# Patient Record
Sex: Female | Born: 1935
Health system: Southern US, Community
[De-identification: ages and names within clinical notes are randomized; demographics above are authoritative.]

## PROBLEM LIST (undated history)

## (undated) DIAGNOSIS — J45909 Unspecified asthma, uncomplicated: Secondary | ICD-10-CM

---

## 1999-12-14 ENCOUNTER — Other Ambulatory Visit: Admission: RE | Admit: 1999-12-14 | Discharge: 1999-12-14 | Payer: Self-pay | Admitting: Family Medicine

## 2004-10-28 ENCOUNTER — Inpatient Hospital Stay (HOSPITAL_COMMUNITY): Admission: EM | Admit: 2004-10-28 | Discharge: 2004-11-03 | Payer: Self-pay | Admitting: Emergency Medicine

## 2005-06-10 ENCOUNTER — Emergency Department (HOSPITAL_COMMUNITY): Admission: EM | Admit: 2005-06-10 | Discharge: 2005-06-10 | Payer: Self-pay | Admitting: Emergency Medicine

## 2011-11-29 DIAGNOSIS — K5900 Constipation, unspecified: Secondary | ICD-10-CM | POA: Diagnosis not present

## 2011-12-17 DIAGNOSIS — I10 Essential (primary) hypertension: Secondary | ICD-10-CM | POA: Diagnosis not present

## 2011-12-17 DIAGNOSIS — R195 Other fecal abnormalities: Secondary | ICD-10-CM | POA: Diagnosis not present

## 2011-12-17 DIAGNOSIS — R7309 Other abnormal glucose: Secondary | ICD-10-CM | POA: Diagnosis not present

## 2011-12-17 DIAGNOSIS — Z79899 Other long term (current) drug therapy: Secondary | ICD-10-CM | POA: Diagnosis not present

## 2011-12-17 DIAGNOSIS — E785 Hyperlipidemia, unspecified: Secondary | ICD-10-CM | POA: Diagnosis not present

## 2012-01-08 DIAGNOSIS — Z1231 Encounter for screening mammogram for malignant neoplasm of breast: Secondary | ICD-10-CM | POA: Diagnosis not present

## 2012-01-16 DIAGNOSIS — R059 Cough, unspecified: Secondary | ICD-10-CM | POA: Diagnosis not present

## 2012-01-16 DIAGNOSIS — A499 Bacterial infection, unspecified: Secondary | ICD-10-CM | POA: Diagnosis not present

## 2012-01-16 DIAGNOSIS — J4489 Other specified chronic obstructive pulmonary disease: Secondary | ICD-10-CM | POA: Diagnosis not present

## 2012-01-16 DIAGNOSIS — N390 Urinary tract infection, site not specified: Secondary | ICD-10-CM | POA: Diagnosis not present

## 2012-01-16 DIAGNOSIS — J449 Chronic obstructive pulmonary disease, unspecified: Secondary | ICD-10-CM | POA: Diagnosis not present

## 2012-01-16 DIAGNOSIS — R05 Cough: Secondary | ICD-10-CM | POA: Diagnosis not present

## 2012-01-17 DIAGNOSIS — N390 Urinary tract infection, site not specified: Secondary | ICD-10-CM | POA: Diagnosis not present

## 2012-01-17 DIAGNOSIS — J449 Chronic obstructive pulmonary disease, unspecified: Secondary | ICD-10-CM | POA: Diagnosis not present

## 2012-02-19 DIAGNOSIS — R195 Other fecal abnormalities: Secondary | ICD-10-CM | POA: Diagnosis not present

## 2012-02-26 DIAGNOSIS — R195 Other fecal abnormalities: Secondary | ICD-10-CM | POA: Diagnosis not present

## 2012-02-26 DIAGNOSIS — K5900 Constipation, unspecified: Secondary | ICD-10-CM | POA: Diagnosis not present

## 2012-05-02 DIAGNOSIS — R195 Other fecal abnormalities: Secondary | ICD-10-CM | POA: Diagnosis not present

## 2012-05-02 DIAGNOSIS — E785 Hyperlipidemia, unspecified: Secondary | ICD-10-CM | POA: Diagnosis not present

## 2012-05-02 DIAGNOSIS — I10 Essential (primary) hypertension: Secondary | ICD-10-CM | POA: Diagnosis not present

## 2012-05-02 DIAGNOSIS — R7309 Other abnormal glucose: Secondary | ICD-10-CM | POA: Diagnosis not present

## 2012-06-20 DIAGNOSIS — K5900 Constipation, unspecified: Secondary | ICD-10-CM | POA: Diagnosis not present

## 2012-11-03 DIAGNOSIS — I10 Essential (primary) hypertension: Secondary | ICD-10-CM | POA: Diagnosis not present

## 2012-11-03 DIAGNOSIS — E785 Hyperlipidemia, unspecified: Secondary | ICD-10-CM | POA: Diagnosis not present

## 2012-11-03 DIAGNOSIS — R7309 Other abnormal glucose: Secondary | ICD-10-CM | POA: Diagnosis not present

## 2012-11-03 DIAGNOSIS — D649 Anemia, unspecified: Secondary | ICD-10-CM | POA: Diagnosis not present

## 2012-11-03 DIAGNOSIS — Z23 Encounter for immunization: Secondary | ICD-10-CM | POA: Diagnosis not present

## 2012-11-03 DIAGNOSIS — E0590 Thyrotoxicosis, unspecified without thyrotoxic crisis or storm: Secondary | ICD-10-CM | POA: Diagnosis not present

## 2013-05-05 DIAGNOSIS — Z23 Encounter for immunization: Secondary | ICD-10-CM | POA: Diagnosis not present

## 2013-05-05 DIAGNOSIS — E785 Hyperlipidemia, unspecified: Secondary | ICD-10-CM | POA: Diagnosis not present

## 2013-05-05 DIAGNOSIS — IMO0002 Reserved for concepts with insufficient information to code with codable children: Secondary | ICD-10-CM | POA: Diagnosis not present

## 2013-05-05 DIAGNOSIS — Z9181 History of falling: Secondary | ICD-10-CM | POA: Diagnosis not present

## 2013-05-05 DIAGNOSIS — Z1331 Encounter for screening for depression: Secondary | ICD-10-CM | POA: Diagnosis not present

## 2013-05-05 DIAGNOSIS — E119 Type 2 diabetes mellitus without complications: Secondary | ICD-10-CM | POA: Diagnosis not present

## 2013-05-18 DIAGNOSIS — M810 Age-related osteoporosis without current pathological fracture: Secondary | ICD-10-CM | POA: Diagnosis not present

## 2013-05-18 DIAGNOSIS — Z1231 Encounter for screening mammogram for malignant neoplasm of breast: Secondary | ICD-10-CM | POA: Diagnosis not present

## 2013-05-18 DIAGNOSIS — N951 Menopausal and female climacteric states: Secondary | ICD-10-CM | POA: Diagnosis not present

## 2013-10-28 DIAGNOSIS — E119 Type 2 diabetes mellitus without complications: Secondary | ICD-10-CM | POA: Diagnosis not present

## 2013-10-28 DIAGNOSIS — I10 Essential (primary) hypertension: Secondary | ICD-10-CM | POA: Diagnosis not present

## 2013-10-28 DIAGNOSIS — E785 Hyperlipidemia, unspecified: Secondary | ICD-10-CM | POA: Diagnosis not present

## 2013-10-28 DIAGNOSIS — D649 Anemia, unspecified: Secondary | ICD-10-CM | POA: Diagnosis not present

## 2013-10-28 DIAGNOSIS — Z23 Encounter for immunization: Secondary | ICD-10-CM | POA: Diagnosis not present

## 2013-11-17 DIAGNOSIS — H5200 Hypermetropia, unspecified eye: Secondary | ICD-10-CM | POA: Diagnosis not present

## 2013-11-17 DIAGNOSIS — H2589 Other age-related cataract: Secondary | ICD-10-CM | POA: Diagnosis not present

## 2013-12-31 ENCOUNTER — Emergency Department (HOSPITAL_COMMUNITY): Payer: Medicare Other

## 2013-12-31 ENCOUNTER — Inpatient Hospital Stay (HOSPITAL_COMMUNITY)
Admission: EM | Admit: 2013-12-31 | Discharge: 2014-01-05 | DRG: 203 | Disposition: A | Discharge status: Home Health | Payer: Medicare Other | Attending: Internal Medicine | Admitting: Internal Medicine

## 2013-12-31 ENCOUNTER — Encounter (HOSPITAL_COMMUNITY): Payer: Self-pay | Admitting: Emergency Medicine

## 2013-12-31 DIAGNOSIS — K449 Diaphragmatic hernia without obstruction or gangrene: Secondary | ICD-10-CM | POA: Diagnosis present

## 2013-12-31 DIAGNOSIS — R109 Unspecified abdominal pain: Secondary | ICD-10-CM

## 2013-12-31 DIAGNOSIS — N281 Cyst of kidney, acquired: Secondary | ICD-10-CM | POA: Diagnosis not present

## 2013-12-31 DIAGNOSIS — R079 Chest pain, unspecified: Secondary | ICD-10-CM | POA: Diagnosis not present

## 2013-12-31 DIAGNOSIS — Z87891 Personal history of nicotine dependence: Secondary | ICD-10-CM | POA: Diagnosis not present

## 2013-12-31 DIAGNOSIS — J209 Acute bronchitis, unspecified: Secondary | ICD-10-CM | POA: Diagnosis present

## 2013-12-31 DIAGNOSIS — R0902 Hypoxemia: Secondary | ICD-10-CM | POA: Diagnosis not present

## 2013-12-31 DIAGNOSIS — R0602 Shortness of breath: Secondary | ICD-10-CM | POA: Diagnosis not present

## 2013-12-31 DIAGNOSIS — Z79899 Other long term (current) drug therapy: Secondary | ICD-10-CM

## 2013-12-31 DIAGNOSIS — K2970 Gastritis, unspecified, without bleeding: Secondary | ICD-10-CM | POA: Diagnosis not present

## 2013-12-31 DIAGNOSIS — J40 Bronchitis, not specified as acute or chronic: Secondary | ICD-10-CM

## 2013-12-31 DIAGNOSIS — J45901 Unspecified asthma with (acute) exacerbation: Principal | ICD-10-CM

## 2013-12-31 DIAGNOSIS — D509 Iron deficiency anemia, unspecified: Secondary | ICD-10-CM | POA: Diagnosis not present

## 2013-12-31 DIAGNOSIS — R10819 Abdominal tenderness, unspecified site: Secondary | ICD-10-CM | POA: Diagnosis not present

## 2013-12-31 DIAGNOSIS — R933 Abnormal findings on diagnostic imaging of other parts of digestive tract: Secondary | ICD-10-CM | POA: Diagnosis not present

## 2013-12-31 DIAGNOSIS — J45909 Unspecified asthma, uncomplicated: Secondary | ICD-10-CM

## 2013-12-31 DIAGNOSIS — K2990 Gastroduodenitis, unspecified, without bleeding: Secondary | ICD-10-CM | POA: Diagnosis not present

## 2013-12-31 DIAGNOSIS — I10 Essential (primary) hypertension: Secondary | ICD-10-CM | POA: Diagnosis not present

## 2013-12-31 DIAGNOSIS — R110 Nausea: Secondary | ICD-10-CM | POA: Diagnosis not present

## 2013-12-31 HISTORY — DX: Unspecified asthma, uncomplicated: J45.909

## 2013-12-31 LAB — I-STAT CG4 LACTIC ACID, ED: Lactic Acid, Venous: 1.51 mmol/L (ref 0.5–2.2)

## 2013-12-31 LAB — URINALYSIS, ROUTINE W REFLEX MICROSCOPIC
Bilirubin Urine: NEGATIVE
Glucose, UA: NEGATIVE mg/dL
Hgb urine dipstick: NEGATIVE
Ketones, ur: NEGATIVE mg/dL
Leukocytes, UA: NEGATIVE
Nitrite: NEGATIVE
Protein, ur: 30 mg/dL — AB
Specific Gravity, Urine: 1.018 (ref 1.005–1.030)
Urobilinogen, UA: 1 mg/dL (ref 0.0–1.0)
pH: 5 (ref 5.0–8.0)

## 2013-12-31 LAB — COMPREHENSIVE METABOLIC PANEL
ALT: 22 U/L (ref 0–35)
Albumin: 3.4 g/dL — ABNORMAL LOW (ref 3.5–5.2)
Alkaline Phosphatase: 75 U/L (ref 39–117)
BUN: 31 mg/dL — ABNORMAL HIGH (ref 6–23)
CO2: 24 mEq/L (ref 19–32)
Calcium: 9.3 mg/dL (ref 8.4–10.5)
Chloride: 102 mEq/L (ref 96–112)
Creatinine, Ser: 1.01 mg/dL (ref 0.50–1.10)
GFR calc Af Amer: 60 mL/min — ABNORMAL LOW (ref >90)
GFR calc non Af Amer: 52 mL/min — ABNORMAL LOW (ref >90)
Glucose, Bld: 161 mg/dL — ABNORMAL HIGH (ref 70–99)
Potassium: 4.9 mEq/L (ref 3.7–5.3)
Sodium: 141 mEq/L (ref 137–147)
Total Bilirubin: <0.2 mg/dL — ABNORMAL LOW (ref 0.3–1.2)
Total Protein: 7.2 g/dL (ref 6.0–8.3)

## 2013-12-31 LAB — CBC WITH DIFFERENTIAL/PLATELET
Basophils Absolute: 0 10*3/uL (ref 0.0–0.1)
Basophils Relative: 0 % (ref 0–1)
Eosinophils Absolute: 0 K/uL (ref 0.0–0.7)
Eosinophils Relative: 0 % (ref 0–5)
HCT: 33.9 % — ABNORMAL LOW (ref 36.0–46.0)
Hemoglobin: 11.1 g/dL — ABNORMAL LOW (ref 12.0–15.0)
Lymphocytes Relative: 11 % — ABNORMAL LOW (ref 12–46)
Lymphs Abs: 1 10*3/uL (ref 0.7–4.0)
MCH: 31.1 pg (ref 26.0–34.0)
MCHC: 32.7 g/dL (ref 30.0–36.0)
MCV: 95 fL (ref 78.0–100.0)
Monocytes Absolute: 0.4 K/uL (ref 0.1–1.0)
Monocytes Relative: 4 % (ref 3–12)
Neutro Abs: 7.9 K/uL — ABNORMAL HIGH (ref 1.7–7.7)
Neutrophils Relative %: 85 % — ABNORMAL HIGH (ref 43–77)
Platelets: 390 10*3/uL (ref 150–400)
RBC: 3.57 MIL/uL — ABNORMAL LOW (ref 3.87–5.11)
RDW: 16 % — ABNORMAL HIGH (ref 11.5–15.5)
WBC: 9.3 10*3/uL (ref 4.0–10.5)

## 2013-12-31 LAB — URINE MICROSCOPIC-ADD ON

## 2013-12-31 LAB — TROPONIN I: Troponin I: <0.3 ng/mL (ref <0.30)

## 2013-12-31 LAB — COMPREHENSIVE METABOLIC PANEL WITH GFR: AST: 34 U/L (ref 0–37)

## 2013-12-31 LAB — PRO B NATRIURETIC PEPTIDE: Pro B Natriuretic peptide (BNP): 116.4 pg/mL (ref 0–450)

## 2013-12-31 LAB — LIPASE, BLOOD: Lipase: 48 U/L (ref 11–59)

## 2013-12-31 MED ORDER — ONDANSETRON HCL 4 MG/2ML IJ SOLN: 4.0000 mg | Freq: Four times a day (QID) | INTRAMUSCULAR | Status: DC | PRN

## 2013-12-31 MED ORDER — ATORVASTATIN CALCIUM 20 MG PO TABS
20.0000 mg | ORAL_TABLET | Freq: Every day | ORAL | Status: DC
  Administered 2014-01-01 – 2014-01-05 (×4): 20 mg via ORAL
  Filled 2013-12-31 (×5): qty 1

## 2013-12-31 MED ORDER — PREDNISONE 50 MG PO TABS
60.0000 mg | ORAL_TABLET | Freq: Every day | ORAL | Status: DC
  Administered 2014-01-01 – 2014-01-03 (×3): 60 mg via ORAL
  Filled 2013-12-31 (×5): qty 1

## 2013-12-31 MED ORDER — PANTOPRAZOLE SODIUM 40 MG PO TBEC
40.0000 mg | DELAYED_RELEASE_TABLET | Freq: Two times a day (BID) | ORAL | Status: DC
  Administered 2014-01-01 – 2014-01-05 (×8): 40 mg via ORAL
  Filled 2013-12-31 (×5): qty 1

## 2013-12-31 MED ORDER — SODIUM CHLORIDE 0.9 % IV SOLN
Freq: Once | INTRAVENOUS | Status: AC
  Administered 2013-12-31: 16:00:00 via INTRAVENOUS

## 2013-12-31 MED ORDER — ALBUTEROL (5 MG/ML) CONTINUOUS INHALATION SOLN: 10.0000 mg/h | INHALATION_SOLUTION | RESPIRATORY_TRACT | Status: DC

## 2013-12-31 MED ORDER — GI COCKTAIL ~~LOC~~
30.0000 mL | Freq: Three times a day (TID) | ORAL | Status: DC | PRN
  Filled 2013-12-31: qty 30

## 2013-12-31 MED ORDER — IOHEXOL 300 MG/ML  SOLN
80.0000 mL | Freq: Once | INTRAMUSCULAR | Status: AC | PRN
  Administered 2013-12-31: 80 mL via INTRAVENOUS

## 2013-12-31 MED ORDER — FENTANYL CITRATE 0.05 MG/ML IJ SOLN
50.0000 ug | Freq: Once | INTRAMUSCULAR | Status: AC
  Administered 2013-12-31: 50 ug via INTRAVENOUS
  Filled 2013-12-31: qty 2

## 2013-12-31 MED ORDER — METHYLPREDNISOLONE SODIUM SUCC 125 MG IJ SOLR
125.0000 mg | Freq: Once | INTRAMUSCULAR | Status: AC
  Administered 2013-12-31: 125 mg via INTRAVENOUS
  Filled 2013-12-31: qty 2

## 2013-12-31 MED ORDER — IOHEXOL 300 MG/ML  SOLN: 25.0000 mL | INTRAMUSCULAR | Status: AC

## 2013-12-31 MED ORDER — LOSARTAN POTASSIUM 50 MG PO TABS
100.0000 mg | ORAL_TABLET | Freq: Every day | ORAL | Status: DC
  Administered 2014-01-01 – 2014-01-05 (×4): 100 mg via ORAL
  Filled 2013-12-31 (×5): qty 2

## 2013-12-31 MED ORDER — AMLODIPINE BESYLATE 10 MG PO TABS
10.0000 mg | ORAL_TABLET | Freq: Every day | ORAL | Status: DC
  Administered 2014-01-01 – 2014-01-05 (×4): 10 mg via ORAL
  Filled 2013-12-31 (×5): qty 1

## 2013-12-31 MED ORDER — SODIUM CHLORIDE 0.9 % IV SOLN
INTRAVENOUS | Status: DC
  Administered 2013-12-31 – 2014-01-04 (×6): via INTRAVENOUS
  Administered 2014-01-05: 1000 mL via INTRAVENOUS

## 2013-12-31 MED ORDER — HEPARIN SODIUM (PORCINE) 5000 UNIT/ML IJ SOLN
5000.0000 [IU] | Freq: Three times a day (TID) | INTRAMUSCULAR | Status: DC
  Administered 2014-01-01 – 2014-01-05 (×12): 5000 [IU] via SUBCUTANEOUS
  Filled 2013-12-31 (×16): qty 1

## 2013-12-31 MED ORDER — ALBUTEROL SULFATE HFA 108 (90 BASE) MCG/ACT IN AERS: 2.0000 | INHALATION_SPRAY | RESPIRATORY_TRACT | Status: DC | PRN

## 2013-12-31 MED ORDER — ALBUTEROL SULFATE (2.5 MG/3ML) 0.083% IN NEBU
5.0000 mg | INHALATION_SOLUTION | Freq: Once | RESPIRATORY_TRACT | Status: AC
  Administered 2013-12-31: 5 mg via RESPIRATORY_TRACT
  Filled 2013-12-31: qty 6

## 2013-12-31 MED ORDER — PANTOPRAZOLE SODIUM 40 MG IV SOLR
40.0000 mg | Freq: Once | INTRAVENOUS | Status: AC
  Administered 2013-12-31: 40 mg via INTRAVENOUS
  Filled 2013-12-31: qty 40

## 2013-12-31 MED ORDER — IPRATROPIUM-ALBUTEROL 0.5-2.5 (3) MG/3ML IN SOLN
3.0000 mL | RESPIRATORY_TRACT | Status: DC
  Administered 2013-12-31 – 2014-01-01 (×2): 3 mL via RESPIRATORY_TRACT
  Filled 2013-12-31 (×3): qty 3

## 2013-12-31 NOTE — ED Notes (Signed)
Phlebotomy at bedside.

## 2013-12-31 NOTE — ED Notes (Signed)
Patient transported to CT 

## 2013-12-31 NOTE — ED Provider Notes (Signed)
CSN: [id number]     Arrival date & time 12/31/13  1507 History   First MD Initiated Contact with Patient 12/31/13 [phone]     Chief Complaint  Patient presents with  . Abdominal Pain     (Consider location/radiation/quality/duration/timing/severity/associated sxs/prior Treatment) HPI Comments: Level 5 caveat due to mild altered mentation, poor memory, poor historian. Pt brought from home by EMS, 911 called by family who are not present.  Pt has been coughing, complaining of left side chest and abd pain.  Pt thinks she might have pneumonia or kidney infection although she can't tell me if she has ever had either.  No fevers, some chills, poor appetite, when she tries to eat, gets nauseated, no vomiting.  She thinks last BM was 2 days ago, but not sure.  No diarrhea.  No new medications, but can't tell me current ones or if any allergies.  Medication list suggests h/o asthma.  Pt used to smoke, reports she quit long ago.    Patient is a 80 y.o. female presenting with abdominal pain. The history is provided by the patient and the EMS personnel. The history is limited by the condition of the patient.  Abdominal Pain   Past Medical History  Diagnosis Date  . Asthma    History reviewed. No pertinent past surgical history. History reviewed. No pertinent family history. History  Substance Use Topics  . Smoking status: Former Games developer  . Smokeless tobacco: Never Used  . Alcohol Use: No   OB History   Grav Para Term Preterm Abortions TAB SAB Ect Mult Living                 Review of Systems  Unable to perform ROS: Acuity of condition  Gastrointestinal: Positive for abdominal pain.      Allergies  Review of patient's allergies indicates no known allergies.  Home Medications   No current outpatient prescriptions on file. BP 123/61  Pulse 100  Temp(Src) 98.4 F (36.9 C) (Oral)  Resp 19  Ht 5\' 4"  (1.626 m)  Wt 119 lb (53.978 kg)  BMI 20.42 kg/m2  SpO2 88% Physical Exam  Nursing  note and vitals reviewed. Constitutional: She appears well-developed and well-nourished. She is cooperative.  Non-toxic appearance. She appears ill. She appears distressed.  HENT:  Head: Normocephalic and atraumatic.  Eyes: Conjunctivae and EOM are normal. No scleral icterus.  Neck: Normal range of motion. Neck supple. No JVD present.  Cardiovascular: Regular rhythm and intact distal pulses.  Frequent extrasystoles are present. Tachycardia present.   Pulmonary/Chest: Effort normal. She has wheezes. She has rales.  Abdominal: Soft. She exhibits no distension. There is tenderness. There is no rebound and no guarding.  Musculoskeletal: She exhibits no edema.  Neurological: She is alert. She exhibits normal muscle tone. Coordination normal.  Skin: Skin is dry. No rash noted. She is not diaphoretic.  Psychiatric: Her mood appears anxious. Her speech is not rapid and/or pressured, not delayed and not tangential. She is not agitated, not aggressive and not combative. She expresses impulsivity. She exhibits abnormal recent memory.    ED Course  Procedures (including critical care time) Labs Review Labs Reviewed  CBC WITH DIFFERENTIAL - Abnormal; Notable for the following:    RBC 3.57 (*)    Hemoglobin 11.1 (*)    HCT 33.9 (*)    RDW 16.0 (*)    Neutrophils Relative % 85 (*)    Neutro Abs 7.9 (*)    Lymphocytes Relative 11 (*)  All other components within normal limits  COMPREHENSIVE METABOLIC PANEL - Abnormal; Notable for the following:    Glucose, Bld 161 (*)    BUN 31 (*)    Albumin 3.4 (*)    Total Bilirubin <0.2 (*)    GFR calc non Af Amer 52 (*)    GFR calc Af Amer 60 (*)    All other components within normal limits  URINALYSIS, ROUTINE W REFLEX MICROSCOPIC - Abnormal; Notable for the following:    Protein, ur 30 (*)    All other components within normal limits  URINE MICROSCOPIC-ADD ON - Abnormal; Notable for the following:    Casts HYALINE CASTS (*)    All other components  within normal limits  CBC - Abnormal; Notable for the following:    RBC 3.18 (*)    Hemoglobin 9.7 (*)    HCT 30.5 (*)    RDW 16.1 (*)    All other components within normal limits  BASIC METABOLIC PANEL - Abnormal; Notable for the following:    Glucose, Bld 149 (*)    GFR calc non Af Amer 61 (*)    GFR calc Af Amer 71 (*)    All other components within normal limits  CULTURE, BLOOD (ROUTINE X 2)  CULTURE, BLOOD (ROUTINE X 2)  LIPASE, BLOOD  TROPONIN I  PRO B NATRIURETIC PEPTIDE  OCCULT BLOOD X 1 CARD TO LAB, STOOL  I-STAT CG4 LACTIC ACID, ED   Imaging Review Ct Abdomen Pelvis W Contrast  12/31/2013   CLINICAL DATA:  Left-sided abdominal pain.  Nausea.  EXAM: CT ABDOMEN AND PELVIS WITH CONTRAST  TECHNIQUE: Multidetector CT imaging of the abdomen and pelvis was performed using the standard protocol following bolus administration of intravenous contrast.  CONTRAST:  80mL OMNIPAQUE IOHEXOL 300 MG/ML IV. Oral contrast was also administered.  COMPARISON:  CT ABD-PELV W/ CM dated 05/15/2011; CT ABD W/CM dated 10/28/2004; CT PELVIS W/CM dated 10/28/2004  FINDINGS: Cecal, descending and sigmoid colon diverticulosis without evidence of acute diverticulitis. Large stool burden throughout the colon. Thickening of the wall of the proximal stomach at the esophagogastric junction, not present on the prior examinations. Remainder of the stomach normal in appearance wall thickening involving the descending duodenum. Remainder of the small bowel normal in appearance. No ascites.  Small cysts in the posterior segment right lobe of liver, unchanged; no significant abnormality involving the liver. Normal spleen, pancreas, and gallbladder. No biliary ductal dilation. Bilateral adrenal enlargement with nodularity involving the left adrenal gland, unchanged, the left adrenal measuring approximately 3.6 x 2.6 cm. Cortical cysts involving both kidneys which are otherwise unremarkable. Extensive aortoiliofemoral and  visceral artery atherosclerosis without aneurysm. No significant lymphadenopathy.  Uterus not visualized and either surgically absent or markedly atrophic. No adnexal masses or free pelvic fluid. Numerous phleboliths in the pelvis. Urinary bladder decompressed and unremarkable.  Bone window images demonstrate severe degenerative changes involving both hips, thoracolumbar scoliosis convex right, severe degenerative disc disease and spondylosis at every level from L2-3 through L5-S1, and lower thoracic spondylosis. Visualized lung bases clear. Bochdalek's hernia involving the posterior right hemidiaphragm with extrusion of intra-abdominal fat, unchanged. Heart size upper normal slightly enlarged but stable.  IMPRESSION: 1. Thickening of the wall of the proximal stomach beginning at the esophagogastric junction, a new finding since the prior examinations. While this might reflect focal inflammation, malignancy is not excluded based on the appearance. Upper endoscopy may be helpful in further evaluation. 2. Thickening of the wall of the descending duodenum  which could also be evaluated at upper endoscopy. 3. Stable adenomatous adrenal hyperplasia. 4. Osseous findings and other incidental findings as above.   Electronically Signed   By: Hulan Saashomas  Lawrence M.D.   On: 12/31/2013 20:21   Dg Chest Port 1 View  12/31/2013   CLINICAL DATA:  Chest pain  EXAM: PORTABLE CHEST - 1 VIEW  COMPARISON:  01/17/2012  FINDINGS: Cardiac shadow is stable. Chronic blunting of left costophrenic angle is noted. No focal infiltrate or sizable effusion is seen. No pneumothorax is noted. No acute bony abnormality is seen.  IMPRESSION: Chronic changes on the left.  No acute abnormality noted.   Electronically Signed   By: Alcide CleverMark  Lukens M.D.   On: 12/31/2013 16:10     RA sat is 89% and I interpret to be abnormal  Improved with 4L [place] O2 to mid 90's.   After nebs, pt continues to have wheezing, O2 sats still in low 90's despite  supplemental.  Pt unable to ambulate well after nebs.  Pt is drinking fluids somewhat better.  Still having abd pain despite pain medications.  CT shows likely GERD changes, although recommendations for endoscopy present.  Will start IV protonix also.   Given continued dyspnea, will admit for continued treatment for bronchitis, wheezing, hypoxia and abd pains.     MDM   Final diagnoses:  Asthma  Bronchitis  Hypoxia  Abdominal pain    Pt with left side pain, abn lung sounds, mild hypoxia.  Pt may have asthma exacerbation, bronchitis, pnuemonia, or possibly CHF.  Will check UA, abd labs including lytes, lipase, hepatic.  Will continue to monitor, provide breathing nebs and obtain PCXR.      Gavin PoundMichael Y. Chistopher Mangino, MD 01/01/14 1455

## 2013-12-31 NOTE — ED Notes (Signed)
Family nurses station pt has finished contrast.

## 2013-12-31 NOTE — ED Notes (Signed)
Report called to floor

## 2013-12-31 NOTE — ED Notes (Signed)
NOTIFIED DR. GHIM IN PERSON OF PATIENTS LAB RESULTS OF CG4+ LACTIC ACID ,17:15 PM .12/31/2013.

## 2013-12-31 NOTE — ED Notes (Signed)
Called RT about Wheeze Protocol

## 2013-12-31 NOTE — ED Notes (Signed)
[facility] 80 [facility] with abdominal pain and productive cough for one week.  Denies V/D but pt has nausea in which [facility] gave 4 mg of Zofran in route.  Pt also c/o left sided abdominal pain upon palpation and pt is ST on monitor.

## 2013-12-31 NOTE — ED Notes (Addendum)
Pt had no difficulties with fluid challenge. Ambulated pt in hallway with walker on room air. 02 sat 87-89%, HR 122. Pt denies dizziness pt states feeling weak. Pt placed back on monitor, 02 100 % on 2.5 lt .

## 2013-12-31 NOTE — ED Notes (Signed)
Pt given cup of water 

## 2013-12-31 NOTE — H&P (Signed)
[facility] History and Physical  Sara Gibson ZOX:[id number] DOB: [birth year] DOA: 12/31/2013  Referring physician: EDP PCP: Sara Ravel, MD   Chief Complaint: Abd pain, SOB   HPI: Sara Gibson is a 80 y.o. female who presents to the ED with Abd pain and productive cough for 1 week history.  No V/D but does have nausea.  Epigastric pain, worse with eating.  Also has productive cough for the past 1 week.  No fevers no chills.  Is SOB.  Work up in ED suggestive of acute bronchitis / asthma exacerbation, and probably gastritis / duodenitis.  Review of Systems: Systems reviewed.  As above, otherwise negative  History reviewed. No pertinent past medical history. History reviewed. No pertinent past surgical history. Social History:  reports that she has quit smoking. She has never used smokeless tobacco. She reports that she does not drink alcohol or use illicit drugs.  No Known Allergies  History reviewed. No pertinent family history.   Prior to Admission medications   Medication Sig Start Date End Date Taking? Authorizing Provider  alendronate (FOSAMAX) 70 MG tablet Take 70 mg by mouth once a week.  12/14/13  Yes Historical Provider, MD  amLODipine (NORVASC) 10 MG tablet Take 10 mg by mouth daily.  12/04/13  Yes Historical Provider, MD  atorvastatin (LIPITOR) 20 MG tablet Take 20 mg by mouth daily.  12/19/13  Yes Historical Provider, MD  losartan (COZAAR) 100 MG tablet Take 100 mg by mouth daily.  12/15/13  Yes Historical Provider, MD  PROVENTIL HFA 108 (90 BASE) MCG/ACT inhaler Inhale 2 puffs into the lungs every 4 (four) hours as needed for shortness of breath.  12/04/13  Yes Historical Provider, MD   Physical Exam: Filed Vitals:   12/31/13 2215  BP: 122/68  Pulse: 103  Temp:   Resp: 20    BP 122/68  Pulse 103  Temp(Src) 97.8 F (36.6 C) (Oral)  Resp 20  SpO2 100%  General Appearance:    Alert, oriented, no distress, appears stated age  Head:    Normocephalic,  atraumatic  Eyes:    PERRL, EOMI, sclera non-icteric        Nose:   Nares without drainage or epistaxis. Mucosa, turbinates normal  Throat:   Moist mucous membranes. Oropharynx without erythema or exudate.  Neck:   Supple. No carotid bruits.  No thyromegaly.  No lymphadenopathy.   Back:     No CVA tenderness, no spinal tenderness  Lungs:     Rales but no wheezes.  Chest wall:    No tenderness to palpitation  Heart:    Regular rate and rhythm without murmurs, gallops, rubs  Abdomen:     Soft, non-tender, nondistended, normal bowel sounds, no organomegaly  Genitalia:    deferred  Rectal:    deferred  Extremities:   No clubbing, cyanosis or edema.  Pulses:   2+ and symmetric all extremities  Skin:   Skin color, texture, turgor normal, no rashes or lesions  Lymph nodes:   Cervical, supraclavicular, and axillary nodes normal  Neurologic:   CNII-XII intact. Normal strength, sensation and reflexes      throughout    Labs on Admission:  Basic Metabolic Panel:  Recent Labs Lab 12/31/13 1529  NA 141  K 4.9  CL 102  CO2 24  GLUCOSE 161*  BUN 31*  CREATININE 1.01  CALCIUM 9.3   Liver Function Tests:  Recent Labs Lab 12/31/13 1529  AST 34  ALT 22  ALKPHOS 75  BILITOT <0.2*  PROT 7.2  ALBUMIN 3.4*    Recent Labs Lab 12/31/13 1529  LIPASE 48   No results found for this basename: AMMONIA,  in the last 168 hours CBC:  Recent Labs Lab 12/31/13 1529  WBC 9.3  NEUTROABS 7.9*  HGB 11.1*  HCT 33.9*  MCV 95.0  PLT 390   Cardiac Enzymes:  Recent Labs Lab 12/31/13 1529  TROPONINI <0.30    BNP (last 3 results)  Recent Labs  12/31/13 1529  PROBNP 116.4   CBG: No results found for this basename: GLUCAP,  in the last 168 hours  Radiological Exams on Admission: Ct Abdomen Pelvis W Contrast  12/31/2013   CLINICAL DATA:  Left-sided abdominal pain.  Nausea.  EXAM: CT ABDOMEN AND PELVIS WITH CONTRAST  TECHNIQUE: Multidetector CT imaging of the abdomen and pelvis  was performed using the standard protocol following bolus administration of intravenous contrast.  CONTRAST:  80mL OMNIPAQUE IOHEXOL 300 MG/ML IV. Oral contrast was also administered.  COMPARISON:  CT ABD-PELV W/ CM dated 05/15/2011; CT ABD W/CM dated 10/28/2004; CT PELVIS W/CM dated 10/28/2004  FINDINGS: Cecal, descending and sigmoid colon diverticulosis without evidence of acute diverticulitis. Large stool burden throughout the colon. Thickening of the wall of the proximal stomach at the esophagogastric junction, not present on the prior examinations. Remainder of the stomach normal in appearance wall thickening involving the descending duodenum. Remainder of the small bowel normal in appearance. No ascites.  Small cysts in the posterior segment right lobe of liver, unchanged; no significant abnormality involving the liver. Normal spleen, pancreas, and gallbladder. No biliary ductal dilation. Bilateral adrenal enlargement with nodularity involving the left adrenal gland, unchanged, the left adrenal measuring approximately 3.6 x 2.6 cm. Cortical cysts involving both kidneys which are otherwise unremarkable. Extensive aortoiliofemoral and visceral artery atherosclerosis without aneurysm. No significant lymphadenopathy.  Uterus not visualized and either surgically absent or markedly atrophic. No adnexal masses or free pelvic fluid. Numerous phleboliths in the pelvis. Urinary bladder decompressed and unremarkable.  Bone window images demonstrate severe degenerative changes involving both hips, thoracolumbar scoliosis convex right, severe degenerative disc disease and spondylosis at every level from L2-3 through L5-S1, and lower thoracic spondylosis. Visualized lung bases clear. Bochdalek's hernia involving the posterior right hemidiaphragm with extrusion of intra-abdominal fat, unchanged. Heart size upper normal slightly enlarged but stable.  IMPRESSION: 1. Thickening of the wall of the proximal stomach beginning at the  esophagogastric junction, a new finding since the prior examinations. While this might reflect focal inflammation, malignancy is not excluded based on the appearance. Upper endoscopy may be helpful in further evaluation. 2. Thickening of the wall of the descending duodenum which could also be evaluated at upper endoscopy. 3. Stable adenomatous adrenal hyperplasia. 4. Osseous findings and other incidental findings as above.   Electronically Signed   By: Hulan Saas M.D.   On: 12/31/2013 20:21   Dg Chest Port 1 View  12/31/2013   CLINICAL DATA:  Chest pain  EXAM: PORTABLE CHEST - 1 VIEW  COMPARISON:  01/17/2012  FINDINGS: Cardiac shadow is stable. Chronic blunting of left costophrenic angle is noted. No focal infiltrate or sizable effusion is seen. No pneumothorax is noted. No acute bony abnormality is seen.  IMPRESSION: Chronic changes on the left.  No acute abnormality noted.   Electronically Signed   By: Alcide Clever M.D.   On: 12/31/2013 16:10    EKG: Independently reviewed.  Assessment/Plan Principal Problem:   Asthma exacerbation Active Problems:  Abdominal pain   1. Asthma exacerbation / acute viral bronchitis - patient's wheezing that she presented with has resolved after numerous breathing treatments and steroids.  Continue steroids and breathing treatments per adult wheeze protocol.  Patient satting 100% and no respiratory distress when stationary on 2.5L via [place].  Is SOB when ambulatory however and desaturates to upper 80s. 2. Abdominal pain - CT scan shows thickening of gastroduodenal junction and duodenum.  Suspect inflammatory or possibly ulcerative pathology given the acute presentation of this.  Neoplasm possible but fits clinical scenario a bit less given the acuity of symptoms.  Treating with PPIs and GI cocktails empirically.  If persists then will require GI eval for EGD, and may require this anyhow.    Code Status: Full Code  Family Communication: Family at  bedside Disposition Plan: Admit to obs   Time spent: 70 min  Lincoln Kleiner M. [facility] Pager [phone]  If 7AM-7PM, please contact the day team taking care of the patient [email or web address] Password [email or web address] 12/31/2013, 10:40 PM

## 2013-12-31 NOTE — ED Notes (Signed)
CT called about contrast. 

## 2014-01-01 ENCOUNTER — Encounter (HOSPITAL_COMMUNITY): Payer: Self-pay | Admitting: *Deleted

## 2014-01-01 DIAGNOSIS — J45909 Unspecified asthma, uncomplicated: Secondary | ICD-10-CM | POA: Diagnosis not present

## 2014-01-01 DIAGNOSIS — R0902 Hypoxemia: Secondary | ICD-10-CM

## 2014-01-01 DIAGNOSIS — R109 Unspecified abdominal pain: Secondary | ICD-10-CM | POA: Diagnosis not present

## 2014-01-01 DIAGNOSIS — J45901 Unspecified asthma with (acute) exacerbation: Secondary | ICD-10-CM | POA: Diagnosis not present

## 2014-01-01 LAB — CBC
HEMATOCRIT: 30.5 % — AB (ref 36.0–46.0)
Hemoglobin: 9.7 g/dL — ABNORMAL LOW (ref 12.0–15.0)
MCH: 30.5 pg (ref 26.0–34.0)
MCHC: 31.8 g/dL (ref 30.0–36.0)
MCV: 95.9 fL (ref 78.0–100.0)
PLATELETS: 362 10*3/uL (ref 150–400)
RBC: 3.18 MIL/uL — ABNORMAL LOW (ref 3.87–5.11)
RDW: 16.1 % — AB (ref 11.5–15.5)
WBC: 10.1 10*3/uL (ref 4.0–10.5)

## 2014-01-01 LAB — BASIC METABOLIC PANEL
BUN: 22 mg/dL (ref 6–23)
CHLORIDE: 106 meq/L (ref 96–112)
CO2: 21 mEq/L (ref 19–32)
Calcium: 8.6 mg/dL (ref 8.4–10.5)
Creatinine, Ser: 0.88 mg/dL (ref 0.50–1.10)
GFR calc non Af Amer: 61 mL/min — ABNORMAL LOW (ref >90)
GFR, EST AFRICAN AMERICAN: 71 mL/min — AB (ref >90)
Glucose, Bld: 149 mg/dL — ABNORMAL HIGH (ref 70–99)
Potassium: 4.6 mEq/L (ref 3.7–5.3)
Sodium: 143 mEq/L (ref 137–147)

## 2014-01-01 MED ORDER — IPRATROPIUM-ALBUTEROL 0.5-2.5 (3) MG/3ML IN SOLN
3.0000 mL | Freq: Two times a day (BID) | RESPIRATORY_TRACT | Status: DC
  Filled 2014-01-01 (×3): qty 3

## 2014-01-01 MED ORDER — ALBUTEROL SULFATE (2.5 MG/3ML) 0.083% IN NEBU
2.5000 mg | INHALATION_SOLUTION | RESPIRATORY_TRACT | Status: DC | PRN
  Administered 2014-01-02: 2.5 mg via RESPIRATORY_TRACT
  Filled 2014-01-01: qty 3

## 2014-01-01 NOTE — Progress Notes (Signed)
Respiratory protocol done with patient scoring a 7. No treatments indicated past a BId level. Patient has no wheezes but some rhonchi with a productive cough. Doesn't like the treatments but takes well. Will begin BId regimen in the Am with Duoneb given Hx and will wean from there.

## 2014-01-01 NOTE — Progress Notes (Signed)
admit  To  Room 6e Orientation  To  Room  Call bell  Safety plan Mental  Status  Oriented x 3 Living arrangements  Home alone with help Skin  intact Family  In attendance  none Assessment  See  Doc flow  sheet

## 2014-01-01 NOTE — Consult Note (Signed)
Subjective:   HPI  The patient is a 80 year old female who was admitted to the hospital yesterday with complaints of a productive cough for a week. She was diagnosed with acute bronchitis/asthma exacerbation. She was having some upper abdominal discomfort as well. A CT scan of the abdomen was done which shows thickening in the proximal stomach beginning at the esophagogastric junction region as well as thickening in the descending duodenum. She denies heartburn. She denies vomiting but does have some nausea.  Review of Systems She is complaining of shortness of breath.  Past Medical History  Diagnosis Date  . Asthma    History reviewed. No pertinent past surgical history. History   Social History  . Marital Status: Divorced    Spouse Name: N/A    Number of Children: N/A  . Years of Education: N/A   Occupational History  . Not on file.   Social History Main Topics  . Smoking status: Former Games developer  . Smokeless tobacco: Never Used  . Alcohol Use: No  . Drug Use: No  . Sexual Activity: Not on file   Other Topics Concern  . Not on file   Social History Narrative  . No narrative on file   family history is not on file. Current facility-administered medications:0.9 %  sodium chloride infusion, , Intravenous, Continuous, Hillary Bow, DO, Last Rate: 75 mL/hr at 12/31/13 2259;  albuterol (PROVENTIL) (2.5 MG/3ML) 0.083% nebulizer solution 2.5 mg, 2.5 mg, Inhalation, Q4H PRN, Hillary Bow, DO;  amLODipine (NORVASC) tablet 10 mg, 10 mg, Oral, Daily, Jared M Gardner, DO, 10 mg at 01/01/14 9528 atorvastatin (LIPITOR) tablet 20 mg, 20 mg, Oral, Daily, Jared M Gardner, DO, 20 mg at 01/01/14 0930;  gi cocktail (Maalox,Lidocaine,Donnatal), 30 mL, Oral, TID PRN, Hillary Bow, DO;  heparin injection 5,000 Units, 5,000 Units, Subcutaneous, 3 times per day, Jared M Gardner, DO;  ipratropium-albuterol (DUONEB) 0.5-2.5 (3) MG/3ML nebulizer solution 3 mL, 3 mL, Nebulization, BID, Jared M  Gardner, DO losartan (COZAAR) tablet 100 mg, 100 mg, Oral, Daily, Jared M Gardner, DO, 100 mg at 01/01/14 4132;  ondansetron [facility]) injection 4 mg, 4 mg, Intravenous, Q6H PRN, Hillary Bow, DO;  pantoprazole (PROTONIX) EC tablet 40 mg, 40 mg, Oral, BID, Jared M Gardner, DO, 40 mg at 01/01/14 4401;  predniSONE (DELTASONE) tablet 60 mg, 60 mg, Oral, Q breakfast, Jared M Gardner, DO, 60 mg at 01/01/14 0272 No Known Allergies   Objective:     BP 123/61  Pulse 100  Temp(Src) 98.4 F (36.9 C) (Oral)  Resp 19  Ht 5\' 4"  (1.626 m)  Wt 53.978 kg (119 lb)  BMI 20.42 kg/m2  SpO2 88%  She is short of breath  Heart regular rhythm no murmurs  Lungs bilateral rhonchi and wheezing  Abdomen is soft and nontender without obvious masses  Laboratory No components found with this basename: d1      Assessment:     Abnormal CT scan showing thickening of the proximal stomach beginning at the region of the esophagogastric junction as well as thickening in the descending duodenum.      Plan:     For now I would recommend treating her GI tract with PPI therapy. At some point she will probably need an upper endoscopy to evaluate the findings on the upper GI tract to determine whether or not these are inflammatory or something worse. At the present time however I would not feel comfortable doing endoscopy with sedation because of her pulmonary  status. Continue to treat pulmonary symptoms. Lab Results  Component Value Date   HGB 9.7* 01/01/2014   HGB 11.1* 12/31/2013   HCT 30.5* 01/01/2014   HCT 33.9* 12/31/2013   ALKPHOS 75 12/31/2013   AST 34 12/31/2013   ALT 22 12/31/2013

## 2014-01-01 NOTE — Progress Notes (Signed)
[facility] PROGRESS NOTE  Sara Gibson NFA:[id number]:[id number] DOB: [birth year] DOA: 12/31/2013 PCP: Ailene RavelHAMRICK,MAURA L, MD  Brief narrative: Sara Gibson is a 80 y.o. female who presents to the ED with Abd pain and productive cough for 1 week history along with SOB. She reports nausea, but no vomiting or diarrhea.  There is associated Epigastric pain, worse with eating.   Assessment/Plan  Asthma exacerbation/bronchitis - Breathing seems to be improved somewhat but not back to baseline.  - She has been initiated on duonebs and steroids - A CXR revealed only chronic changes - O2 as needed to keep pulse ox > 88%  Abdominal pain/nausea - Continues with nausea, she reports this being ongoing for the last 2-3 weeks.  She has not had any vomiting or diarrhea.  She is not dehydrated by labs - CT abdomen (below) with area at GE junction which may reflect inflammation or malignancy - GI consultation for possible EGD - She is anemic, however, has not reported blood in stool.  Will check hemocult.  She reports being told she was anemic in the past - Protonix and GI cocktail  Urinary frequency - This is by report from patient - UA is WNL - Continue to monitor  Anemia - H/H decreased with fluid resuscitation, Hgb 9.7 - Monitor closely - hemocult as above  HTN - Home meds of losartan and amlodipine were continued  DVT PPx: Heparin SQ  Consultants:  GI to be consulted today  Procedures/Studies: Ct Abdomen Pelvis W Contrast  12/31/2013   CLINICAL DATA:  Left-sided abdominal pain.  Nausea.  EXAM: CT ABDOMEN AND PELVIS WITH CONTRAST  TECHNIQUE: Multidetector CT imaging of the abdomen and pelvis was performed using the standard protocol following bolus administration of intravenous contrast.  CONTRAST:  80mL OMNIPAQUE IOHEXOL 300 MG/ML IV. Oral contrast was also administered.  COMPARISON:  CT ABD-PELV W/ CM dated 05/15/2011; CT ABD W/CM dated 10/28/2004; CT PELVIS W/CM dated 10/28/2004  FINDINGS:  Cecal, descending and sigmoid colon diverticulosis without evidence of acute diverticulitis. Large stool burden throughout the colon. Thickening of the wall of the proximal stomach at the esophagogastric junction, not present on the prior examinations. Remainder of the stomach normal in appearance wall thickening involving the descending duodenum. Remainder of the small bowel normal in appearance. No ascites.  Small cysts in the posterior segment right lobe of liver, unchanged; no significant abnormality involving the liver. Normal spleen, pancreas, and gallbladder. No biliary ductal dilation. Bilateral adrenal enlargement with nodularity involving the left adrenal gland, unchanged, the left adrenal measuring approximately 3.6 x 2.6 cm. Cortical cysts involving both kidneys which are otherwise unremarkable. Extensive aortoiliofemoral and visceral artery atherosclerosis without aneurysm. No significant lymphadenopathy.  Uterus not visualized and either surgically absent or markedly atrophic. No adnexal masses or free pelvic fluid. Numerous phleboliths in the pelvis. Urinary bladder decompressed and unremarkable.  Bone window images demonstrate severe degenerative changes involving both hips, thoracolumbar scoliosis convex right, severe degenerative disc disease and spondylosis at every level from L2-3 through L5-S1, and lower thoracic spondylosis. Visualized lung bases clear. Bochdalek's hernia involving the posterior right hemidiaphragm with extrusion of intra-abdominal fat, unchanged. Heart size upper normal slightly enlarged but stable.  IMPRESSION: 1. Thickening of the wall of the proximal stomach beginning at the esophagogastric junction, a new finding since the prior examinations. While this might reflect focal inflammation, malignancy is not excluded based on the appearance. Upper endoscopy may be helpful in further evaluation. 2. Thickening of the wall of the descending duodenum which  could also be evaluated  at upper endoscopy. 3. Stable adenomatous adrenal hyperplasia. 4. Osseous findings and other incidental findings as above.   Electronically Signed   By: Hulan Saas M.D.   On: 12/31/2013 20:21   Dg Chest Port 1 View  12/31/2013   CLINICAL DATA:  Chest pain  EXAM: PORTABLE CHEST - 1 VIEW  COMPARISON:  01/17/2012  FINDINGS: Cardiac shadow is stable. Chronic blunting of left costophrenic angle is noted. No focal infiltrate or sizable effusion is seen. No pneumothorax is noted. No acute bony abnormality is seen.  IMPRESSION: Chronic changes on the left.  No acute abnormality noted.   Electronically Signed   By: Alcide Clever M.D.   On: 12/31/2013 16:10      Code Status: Full Family Communication: Pt at bedside Disposition Plan: Home when medically stable  HPI/Subjective: Patient continues to have problems with breathing, though she cannot exactly say what the issue is.  She gets very upset with further questioning.  She tender to palpation in the epigastrium and has been told she has "low blood" in the past.  She continues to be nauseated and has been so for about 2 weeks.  She also reports that "anytime" she moves she has to go to the bathroom.    Objective: Filed Vitals:   12/31/13 2315 01/01/14 0016 01/01/14 0430 01/01/14 0910  BP: 104/65 130/76 138/79 121/61  Pulse: 95 110 103 111  Temp:  99.7 F (37.6 C) 98.4 F (36.9 C) 98.2 F (36.8 C)  TempSrc:  Oral Oral Oral  Resp: 17 16 16 19   Height:  5\' 4"  (1.626 m)    Weight:  119 lb (53.978 kg)    SpO2: 94% 95% 93% 96%   No intake or output data in the 24 hours ending 01/01/14 1010  Exam:   General:  Pt is alert, follows commands appropriately, not in acute distress  Cardiovascular: Regular rate and rhythm, S1/S2  Respiratory: Clear to auscultation bilaterally, + expiratory wheezing, no crackles  Abdomen: Soft, tender to palpation in epigastrium, non distended, bowel sounds present, no guarding  Extremities: No edema, warm  and dry  Neuro: Grossly nonfocal  Data Reviewed: Basic Metabolic Panel:  Recent Labs Lab 12/31/13 1529 01/01/14 0339  NA 141 143  K 4.9 4.6  CL 102 106  CO2 24 21  GLUCOSE 161* 149*  BUN 31* 22  CREATININE 1.01 0.88  CALCIUM 9.3 8.6   Liver Function Tests:  Recent Labs Lab 12/31/13 1529  AST 34  ALT 22  ALKPHOS 75  BILITOT <0.2*  PROT 7.2  ALBUMIN 3.4*    Recent Labs Lab 12/31/13 1529  LIPASE 48   No results found for this basename: AMMONIA,  in the last 168 hours CBC:  Recent Labs Lab 12/31/13 1529 01/01/14 0339  WBC 9.3 10.1  NEUTROABS 7.9*  --   HGB 11.1* 9.7*  HCT 33.9* 30.5*  MCV 95.0 95.9  PLT 390 362   Cardiac Enzymes:  Recent Labs Lab 12/31/13 1529  TROPONINI <0.30   BNP: No components found with this basename: POCBNP,  CBG: No results found for this basename: GLUCAP,  in the last 168 hours  Recent Results (from the past 240 hour(s))  CULTURE, BLOOD (ROUTINE X 2)     Status: None   Collection Time    12/31/13  3:41 PM      Result Value Ref Range Status   Specimen Description BLOOD HAND LEFT   Final   Special  Requests BOTTLES DRAWN AEROBIC AND ANAEROBIC 5CC   Final   Culture  Setup Time     Final   Value: 12/31/2013 20:06     Performed at [facility]   Culture     Final   Value:        BLOOD CULTURE RECEIVED NO GROWTH TO DATE CULTURE WILL BE HELD FOR 5 DAYS BEFORE ISSUING A FINAL NEGATIVE REPORT     Performed at [facility]   Report Status PENDING   Incomplete  CULTURE, BLOOD (ROUTINE X 2)     Status: None   Collection Time    12/31/13  4:31 PM      Result Value Ref Range Status   Specimen Description BLOOD HAND RIGHT   Final   Special Requests BOTTLES DRAWN AEROBIC ONLY 5CC   Final   Culture  Setup Time     Final   Value: 12/31/2013 21:52     Performed at [facility]   Culture     Final   Value:        BLOOD CULTURE RECEIVED NO GROWTH TO DATE CULTURE WILL BE HELD FOR 5 DAYS BEFORE ISSUING A  FINAL NEGATIVE REPORT     Performed at [facility]   Report Status PENDING   Incomplete     Scheduled Meds: . amLODipine  10 mg Oral Daily  . atorvastatin  20 mg Oral Daily  . heparin  5,000 Units Subcutaneous 3 times per day  . ipratropium-albuterol  3 mL Nebulization BID  . losartan  100 mg Oral Daily  . pantoprazole  40 mg Oral BID  . predniSONE  60 mg Oral Q breakfast   Continuous Infusions: . sodium chloride 75 mL/hr at 12/31/13 2259     Debe Coder, MD  [facility] Pager [phone]  If 7PM-7AM, please contact night-coverage [email or web address] Password [email or web address] 01/01/2014, 10:10 AM   LOS: 1 day

## 2014-01-02 DIAGNOSIS — J45901 Unspecified asthma with (acute) exacerbation: Secondary | ICD-10-CM | POA: Diagnosis not present

## 2014-01-02 DIAGNOSIS — R0902 Hypoxemia: Secondary | ICD-10-CM | POA: Diagnosis not present

## 2014-01-02 LAB — FERRITIN: FERRITIN: 35 ng/mL (ref 10–291)

## 2014-01-02 LAB — HEMOGLOBIN AND HEMATOCRIT, BLOOD
HCT: 27.7 % — ABNORMAL LOW (ref 36.0–46.0)
Hemoglobin: 8.9 g/dL — ABNORMAL LOW (ref 12.0–15.0)

## 2014-01-02 LAB — IRON AND TIBC
IRON: 23 ug/dL — AB (ref 42–135)
Saturation Ratios: 8 % — ABNORMAL LOW (ref 20–55)
TIBC: 295 ug/dL (ref 250–470)
UIBC: 272 ug/dL (ref 125–400)

## 2014-01-02 LAB — LACTATE DEHYDROGENASE: LDH: 217 U/L (ref 94–250)

## 2014-01-02 LAB — VITAMIN B12: Vitamin B-12: 882 pg/mL (ref 211–911)

## 2014-01-02 MED ORDER — LORAZEPAM 0.5 MG PO TABS
0.5000 mg | ORAL_TABLET | Freq: Once | ORAL | Status: AC
  Administered 2014-01-02: 0.5 mg via ORAL
  Filled 2014-01-02: qty 1

## 2014-01-02 MED ORDER — DM-GUAIFENESIN ER 30-600 MG PO TB12
1.0000 | ORAL_TABLET | Freq: Two times a day (BID) | ORAL | Status: DC
  Administered 2014-01-02 – 2014-01-05 (×5): 1 via ORAL
  Filled 2014-01-02 (×7): qty 1

## 2014-01-02 MED ORDER — IPRATROPIUM-ALBUTEROL 0.5-2.5 (3) MG/3ML IN SOLN
3.0000 mL | Freq: Four times a day (QID) | RESPIRATORY_TRACT | Status: DC
  Administered 2014-01-02 – 2014-01-04 (×9): 3 mL via RESPIRATORY_TRACT
  Filled 2014-01-02 (×10): qty 3

## 2014-01-02 NOTE — Progress Notes (Signed)
Utilization Review Completed.   Chelle Cayton, RN, BSN Nurse Case Manager  

## 2014-01-02 NOTE — Progress Notes (Signed)
[facility] PROGRESS NOTE  Sara Pheasantva Henly AOZ:[id number] DOB: [birth year] DOA: 12/31/2013 PCP: Ailene RavelHAMRICK,MAURA L, MD  Brief narrative: Sara Gibson is a 80 y.o. female who presents to the ED with Abd pain and productive cough for 1 week history along with SOB. She reports nausea, but no vomiting or diarrhea.  There is associated Epigastric pain, worse with eating.   Assessment/Plan  Asthma exacerbation/bronchitis - increase frequency of nebs this AM to q6hours b/c diminished air movement on exam  - on duonebs and pred 60 day 2  - cont [place] as needed   Abdominal pain/nausea - CT abdomen (below) with area of wall thickening at GE junction which may reflect inflammation or malignancy - GI consultation indicated PPI therapy at this time , which has been initiated  - likely will need EGD as outpt  Anemia , unknown chronicity - on PPI - continues to decline, 8.9 today, check daily CBC  - pt tachycardic but on nebs  - w/u w/ lysis and vitamin def labs , and hemmocult   HTN - Home meds of losartan and amlodipine were continued  DVT PPx: Heparin SQ  Consultants:  GI    Procedures/Studies: Ct Abdomen Pelvis W Contrast  12/31/2013   CLINICAL DATA:  Left-sided abdominal pain.  Nausea.  EXAM: CT ABDOMEN AND PELVIS WITH CONTRAST  TECHNIQUE: Multidetector CT imaging of the abdomen and pelvis was performed using the standard protocol following bolus administration of intravenous contrast.  CONTRAST:  80mL OMNIPAQUE IOHEXOL 300 MG/ML IV. Oral contrast was also administered.  COMPARISON:  CT ABD-PELV W/ CM dated 05/15/2011; CT ABD W/CM dated 10/28/2004; CT PELVIS W/CM dated 10/28/2004  FINDINGS: Cecal, descending and sigmoid colon diverticulosis without evidence of acute diverticulitis. Large stool burden throughout the colon. Thickening of the wall of the proximal stomach at the esophagogastric junction, not present on the prior examinations. Remainder of the stomach normal in appearance wall  thickening involving the descending duodenum. Remainder of the small bowel normal in appearance. No ascites.  Small cysts in the posterior segment right lobe of liver, unchanged; no significant abnormality involving the liver. Normal spleen, pancreas, and gallbladder. No biliary ductal dilation. Bilateral adrenal enlargement with nodularity involving the left adrenal gland, unchanged, the left adrenal measuring approximately 3.6 x 2.6 cm. Cortical cysts involving both kidneys which are otherwise unremarkable. Extensive aortoiliofemoral and visceral artery atherosclerosis without aneurysm. No significant lymphadenopathy.  Uterus not visualized and either surgically absent or markedly atrophic. No adnexal masses or free pelvic fluid. Numerous phleboliths in the pelvis. Urinary bladder decompressed and unremarkable.  Bone window images demonstrate severe degenerative changes involving both hips, thoracolumbar scoliosis convex right, severe degenerative disc disease and spondylosis at every level from L2-3 through L5-S1, and lower thoracic spondylosis. Visualized lung bases clear. Bochdalek's hernia involving the posterior right hemidiaphragm with extrusion of intra-abdominal fat, unchanged. Heart size upper normal slightly enlarged but stable.  IMPRESSION: 1. Thickening of the wall of the proximal stomach beginning at the esophagogastric junction, a new finding since the prior examinations. While this might reflect focal inflammation, malignancy is not excluded based on the appearance. Upper endoscopy may be helpful in further evaluation. 2. Thickening of the wall of the descending duodenum which could also be evaluated at upper endoscopy. 3. Stable adenomatous adrenal hyperplasia. 4. Osseous findings and other incidental findings as above.   Electronically Signed   By: Hulan Saashomas  Lawrence M.D.   On: 12/31/2013 20:21   Dg Chest Port 1 View  12/31/2013   CLINICAL DATA:  Chest pain  EXAM: PORTABLE CHEST - 1 VIEW   COMPARISON:  01/17/2012  FINDINGS: Cardiac shadow is stable. Chronic blunting of left costophrenic angle is noted. No focal infiltrate or sizable effusion is seen. No pneumothorax is noted. No acute bony abnormality is seen.  IMPRESSION: Chronic changes on the left.  No acute abnormality noted.   Electronically Signed   By: Alcide Clever M.D.   On: 12/31/2013 16:10     Code Status: Full Family Communication: Pt at bedside Disposition Plan: Home when medically stable  HPI/Subjective: Had to talk into doing her duoneb treatment this AM  No events overnight  On O2   Objective: Filed Vitals:   01/01/14 2142 01/02/14 0253 01/02/14 0436 01/02/14 0446  BP: 129/78  131/68   Pulse: 111  102   Temp: 99.7 F (37.6 C)  99 F (37.2 C)   TempSrc: Oral  Oral   Resp: 19  18   Height:      Weight: 56.201 kg (123 lb 14.4 oz)     SpO2: 96% 94% 90% 94%    Intake/Output Summary (Last 24 hours) at 01/02/14 0826 Last data filed at 01/02/14 0656  Gross per 24 hour  Intake   1135 ml  Output      0 ml  Net   1135 ml    Exam:   General:  Pt is alert, follows commands appropriately, not in acute distress  Cardiovascular: Regular rate and rhythm, S1/S2  Respiratory: Clear to auscultation bilaterally, + expiratory wheezing, diminished air movement throughout , no crackles  Abdomen: Soft, tender to palpation in epigastrium, non distended, bowel sounds present, no guarding  Extremities: No edema, warm and dry  Neuro: Grossly nonfocal  Data Reviewed: Basic Metabolic Panel:  Recent Labs Lab 12/31/13 1529 01/01/14 0339  NA 141 143  K 4.9 4.6  CL 102 106  CO2 24 21  GLUCOSE 161* 149*  BUN 31* 22  CREATININE 1.01 0.88  CALCIUM 9.3 8.6   Liver Function Tests:  Recent Labs Lab 12/31/13 1529  AST 34  ALT 22  ALKPHOS 75  BILITOT <0.2*  PROT 7.2  ALBUMIN 3.4*    Recent Labs Lab 12/31/13 1529  LIPASE 48   No results found for this basename: AMMONIA,  in the last 168  hours CBC:  Recent Labs Lab 12/31/13 1529 01/01/14 0339 01/02/14 0411  WBC 9.3 10.1  --   NEUTROABS 7.9*  --   --   HGB 11.1* 9.7* 8.9*  HCT 33.9* 30.5* 27.7*  MCV 95.0 95.9  --   PLT 390 362  --    Cardiac Enzymes:  Recent Labs Lab 12/31/13 1529  TROPONINI <0.30   BNP: No components found with this basename: POCBNP,  CBG: No results found for this basename: GLUCAP,  in the last 168 hours  Recent Results (from the past 240 hour(s))  CULTURE, BLOOD (ROUTINE X 2)     Status: None   Collection Time    12/31/13  3:41 PM      Result Value Ref Range Status   Specimen Description BLOOD HAND LEFT   Final   Special Requests BOTTLES DRAWN AEROBIC AND ANAEROBIC 5CC   Final   Culture  Setup Time     Final   Value: 12/31/2013 20:06     Performed at [facility]   Culture     Final   Value:        BLOOD CULTURE RECEIVED NO GROWTH TO DATE  CULTURE WILL BE HELD FOR 5 DAYS BEFORE ISSUING A FINAL NEGATIVE REPORT     Performed at [facility]   Report Status PENDING   Incomplete  CULTURE, BLOOD (ROUTINE X 2)     Status: None   Collection Time    12/31/13  4:31 PM      Result Value Ref Range Status   Specimen Description BLOOD HAND RIGHT   Final   Special Requests BOTTLES DRAWN AEROBIC ONLY 5CC   Final   Culture  Setup Time     Final   Value: 12/31/2013 21:52     Performed at [facility]   Culture     Final   Value:        BLOOD CULTURE RECEIVED NO GROWTH TO DATE CULTURE WILL BE HELD FOR 5 DAYS BEFORE ISSUING A FINAL NEGATIVE REPORT     Performed at [facility]   Report Status PENDING   Incomplete     Scheduled Meds: . amLODipine  10 mg Oral Daily  . atorvastatin  20 mg Oral Daily  . heparin  5,000 Units Subcutaneous 3 times per day  . ipratropium-albuterol  3 mL Nebulization BID  . losartan  100 mg Oral Daily  . pantoprazole  40 mg Oral BID  . predniSONE  60 mg Oral Q breakfast   Continuous Infusions: . sodium chloride 75 mL/hr at  01/02/14 0428     Alysia Penna, MD  [facility] Pager [phone]  If 7PM-7AM, please contact night-coverage [email or web address] Password [facility] 01/02/2014, 8:26 AM   LOS: 2 days

## 2014-01-03 DIAGNOSIS — R109 Unspecified abdominal pain: Secondary | ICD-10-CM | POA: Diagnosis not present

## 2014-01-03 DIAGNOSIS — J45909 Unspecified asthma, uncomplicated: Secondary | ICD-10-CM | POA: Diagnosis not present

## 2014-01-03 DIAGNOSIS — J45901 Unspecified asthma with (acute) exacerbation: Secondary | ICD-10-CM | POA: Diagnosis not present

## 2014-01-03 LAB — BASIC METABOLIC PANEL
BUN: 10 mg/dL (ref 6–23)
CHLORIDE: 102 meq/L (ref 96–112)
CO2: 26 mEq/L (ref 19–32)
CREATININE: 0.74 mg/dL (ref 0.50–1.10)
Calcium: 9 mg/dL (ref 8.4–10.5)
GFR calc non Af Amer: 79 mL/min — ABNORMAL LOW (ref >90)
GLUCOSE: 75 mg/dL (ref 70–99)
POTASSIUM: 3.6 meq/L — AB (ref 3.7–5.3)
Sodium: 142 mEq/L (ref 137–147)

## 2014-01-03 LAB — HAPTOGLOBIN: Haptoglobin: 458 mg/dL — ABNORMAL HIGH (ref 45–215)

## 2014-01-03 LAB — CBC
HEMATOCRIT: 32.6 % — AB (ref 36.0–46.0)
HEMOGLOBIN: 10.5 g/dL — AB (ref 12.0–15.0)
MCH: 30.5 pg (ref 26.0–34.0)
MCHC: 32.2 g/dL (ref 30.0–36.0)
MCV: 94.8 fL (ref 78.0–100.0)
Platelets: 386 10*3/uL (ref 150–400)
RBC: 3.44 MIL/uL — ABNORMAL LOW (ref 3.87–5.11)
RDW: 15.7 % — ABNORMAL HIGH (ref 11.5–15.5)
WBC: 9.5 10*3/uL (ref 4.0–10.5)

## 2014-01-03 LAB — FOLATE RBC: RBC Folate: 686 ng/mL — ABNORMAL HIGH (ref >280)

## 2014-01-03 MED ORDER — FERROUS SULFATE 325 (65 FE) MG PO TABS
325.0000 mg | ORAL_TABLET | Freq: Two times a day (BID) | ORAL | Status: DC
  Administered 2014-01-03 – 2014-01-05 (×3): 325 mg via ORAL
  Filled 2014-01-03 (×6): qty 1

## 2014-01-03 NOTE — Progress Notes (Signed)
[facility] PROGRESS NOTE  Sara Gibson VWU:[id number] DOB: [birth year] DOA: 12/31/2013 PCP: Ailene Ravel, MD  Brief narrative: Sara Gibson is a 80 y.o. female who presents to the ED with Abd pain and productive cough for 1 week history along with SOB. She reports nausea, but no vomiting or diarrhea.  There is associated Epigastric pain, worse with eating.   Assessment/Plan  Asthma exacerbation/bronchitis - cont nebs this AM at q6hours b/c diminished air movement on exam  - on duonebs and pred 60, day 3  - cont [place] as needed   ? Dysphagia  - ordering speech eval but will continue diet as currently prescribed. Pt is difficult to communicate with but I wonder if swallowing food is exacerbating her cough/dyspnea   Abdominal pain/nausea - CT abdomen (below) with area of wall thickening at GE junction which may reflect inflammation or malignancy - GI consultation indicated PPI therapy at this time , which has been initiated  - likely will need EGD as outpt  Anemia , unknown chronicity, fe def  - on PPI BID  2/2 CT findings  - Hgb was down 2gms yesterday since admit. CBC pending this AM  - starting on ferrous sulfate b/c iron def on labs  - if continues to decline and pulm status improves,  would recontact GI and let them reconsider doing the EGD this admit instead of after discharge, esp w/ possible gastritis on CT scan   HTN - Home meds of losartan and amlodipine were continued  DVT PPx: Heparin SQ  Consultants:  GI    Procedures/Studies: No results found.   Code Status: Full Family Communication: none  Disposition Plan: Home when medically stable  HPI/Subjective: Had to talk into doing her duoneb treatment this AM  No events overnight  On O2   Objective: Filed Vitals:   01/02/14 2021 01/02/14 2139 01/03/14 0416 01/03/14 0804  BP: 135/90  130/56   Pulse: 91  93   Temp: 98.2 F (36.8 C)  97.8 F (36.6 C)   TempSrc: Axillary  Axillary   Resp: 20  16    Height:      Weight: 56.609 kg (124 lb 12.8 oz)     SpO2: 97% 98% 97% 95%    Intake/Output Summary (Last 24 hours) at 01/03/14 0931 Last data filed at 01/03/14 0710  Gross per 24 hour  Intake   1740 ml  Output      0 ml  Net   1740 ml    Exam:   General:  Pt is alert, follows commands appropriately, not in acute distress  Cardiovascular: Regular rate and rhythm, S1/S2  Respiratory: Clear to auscultation bilaterally, + expiratory wheezing, positive air movement throughout , no crackles  Abdomen: Soft, tender to palpation in epigastrium, non distended, bowel sounds present, no guarding  Extremities: No edema, warm and dry  Neuro: Grossly nonfocal  Data Reviewed: Basic Metabolic Panel:  Recent Labs Lab 12/31/13 1529 01/01/14 0339  NA 141 143  K 4.9 4.6  CL 102 106  CO2 24 21  GLUCOSE 161* 149*  BUN 31* 22  CREATININE 1.01 0.88  CALCIUM 9.3 8.6   Liver Function Tests:  Recent Labs Lab 12/31/13 1529  AST 34  ALT 22  ALKPHOS 75  BILITOT <0.2*  PROT 7.2  ALBUMIN 3.4*    Recent Labs Lab 12/31/13 1529  LIPASE 48   No results found for this basename: AMMONIA,  in the last 168 hours CBC:  Recent Labs Lab 12/31/13 1529  01/01/14 0339 01/02/14 0411  WBC 9.3 10.1  --   NEUTROABS 7.9*  --   --   HGB 11.1* 9.7* 8.9*  HCT 33.9* 30.5* 27.7*  MCV 95.0 95.9  --   PLT 390 362  --    Cardiac Enzymes:  Recent Labs Lab 12/31/13 1529  TROPONINI <0.30   BNP: No components found with this basename: POCBNP,  CBG: No results found for this basename: GLUCAP,  in the last 168 hours  Recent Results (from the past 240 hour(s))  CULTURE, BLOOD (ROUTINE X 2)     Status: None   Collection Time    12/31/13  3:41 PM      Result Value Ref Range Status   Specimen Description BLOOD HAND LEFT   Final   Special Requests BOTTLES DRAWN AEROBIC AND ANAEROBIC 5CC   Final   Culture  Setup Time     Final   Value: 12/31/2013 20:06     [facility]    Culture     Final   Value:        BLOOD CULTURE RECEIVED NO GROWTH TO DATE CULTURE WILL BE HELD FOR 5 DAYS BEFORE ISSUING A FINAL NEGATIVE REPORT     [facility]   Report Status PENDING   Incomplete  CULTURE, BLOOD (ROUTINE X 2)     Status: None   Collection Time    12/31/13  4:31 PM      Result Value Ref Range Status   Specimen Description BLOOD HAND RIGHT   Final   Special Requests BOTTLES DRAWN AEROBIC ONLY 5CC   Final   Culture  Setup Time     Final   Value: 12/31/2013 21:52     [facility]   Culture     Final   Value:        BLOOD CULTURE RECEIVED NO GROWTH TO DATE CULTURE WILL BE HELD FOR 5 DAYS BEFORE ISSUING A FINAL NEGATIVE REPORT     [facility] at [facility]   Report Status PENDING   Incomplete     Scheduled Meds: . amLODipine  10 mg Oral Daily  . atorvastatin  20 mg Oral Daily  . dextromethorphan-guaiFENesin  1 tablet Oral BID  . heparin  5,000 Units Subcutaneous 3 times per day  . ipratropium-albuterol  3 mL Nebulization Q6H  . losartan  100 mg Oral Daily  . pantoprazole  40 mg Oral BID  . predniSONE  60 mg Oral Q breakfast   Continuous Infusions: . sodium chloride 75 mL/hr at 01/03/14 [id number]     Alysia PennaHOLWERDA, Leda [place], MD  [facility] Pager [phone]  If 7PM-7AM, please contact night-coverage [email or web address] Password [email or web address] 01/03/2014, 9:31 AM   LOS: 3 days

## 2014-01-03 NOTE — Progress Notes (Signed)
The patient was seen again in regards to the abnormal x-ray showing thickening of the proximal stomach and descending duodenum. When previously seen she was quite short of breath. Today she is feeling much better.  Heart regular rhythm  Lungs with rhonchi and some wheezing, but in no distress  Impression: Abnormal x-ray as mentioned above  Plan: I think it will be okay to proceed with EGD tomorrow.

## 2014-01-04 ENCOUNTER — Encounter (HOSPITAL_COMMUNITY)
Admission: EM | Disposition: A | Discharge status: Home Health | Payer: Self-pay | Source: Home / Self Care | Attending: Internal Medicine

## 2014-01-04 ENCOUNTER — Encounter (HOSPITAL_COMMUNITY): Payer: Self-pay

## 2014-01-04 DIAGNOSIS — J45901 Unspecified asthma with (acute) exacerbation: Secondary | ICD-10-CM | POA: Diagnosis not present

## 2014-01-04 DIAGNOSIS — R0902 Hypoxemia: Secondary | ICD-10-CM | POA: Diagnosis not present

## 2014-01-04 DIAGNOSIS — R933 Abnormal findings on diagnostic imaging of other parts of digestive tract: Secondary | ICD-10-CM | POA: Diagnosis not present

## 2014-01-04 DIAGNOSIS — D509 Iron deficiency anemia, unspecified: Secondary | ICD-10-CM

## 2014-01-04 DIAGNOSIS — I10 Essential (primary) hypertension: Secondary | ICD-10-CM

## 2014-01-04 DIAGNOSIS — J40 Bronchitis, not specified as acute or chronic: Secondary | ICD-10-CM | POA: Diagnosis not present

## 2014-01-04 HISTORY — PX: ESOPHAGOGASTRODUODENOSCOPY: SHX5428

## 2014-01-04 LAB — CBC
HEMATOCRIT: 31.8 % — AB (ref 36.0–46.0)
HEMOGLOBIN: 10.4 g/dL — AB (ref 12.0–15.0)
MCH: 30.5 pg (ref 26.0–34.0)
MCHC: 32.7 g/dL (ref 30.0–36.0)
MCV: 93.3 fL (ref 78.0–100.0)
Platelets: 366 10*3/uL (ref 150–400)
RBC: 3.41 MIL/uL — ABNORMAL LOW (ref 3.87–5.11)
RDW: 15.2 % (ref 11.5–15.5)
WBC: 7.9 10*3/uL (ref 4.0–10.5)

## 2014-01-04 LAB — BASIC METABOLIC PANEL
BUN: 10 mg/dL (ref 6–23)
CALCIUM: 9 mg/dL (ref 8.4–10.5)
CO2: 24 mEq/L (ref 19–32)
CREATININE: 0.64 mg/dL (ref 0.50–1.10)
Chloride: 103 mEq/L (ref 96–112)
GFR calc Af Amer: >90 mL/min (ref >90)
GFR calc non Af Amer: 83 mL/min — ABNORMAL LOW (ref >90)
Glucose, Bld: 79 mg/dL (ref 70–99)
Potassium: 3.8 mEq/L (ref 3.7–5.3)
Sodium: 140 mEq/L (ref 137–147)

## 2014-01-04 SURGERY — EGD (ESOPHAGOGASTRODUODENOSCOPY)
Anesthesia: Moderate Sedation

## 2014-01-04 MED ORDER — MIDAZOLAM HCL 5 MG/ML IJ SOLN
INTRAMUSCULAR | Status: AC
  Filled 2014-01-04: qty 2

## 2014-01-04 MED ORDER — METHYLPREDNISOLONE SODIUM SUCC 125 MG IJ SOLR
60.0000 mg | Freq: Two times a day (BID) | INTRAMUSCULAR | Status: DC
  Administered 2014-01-04: 60 mg via INTRAVENOUS
  Administered 2014-01-04: 125 mg via INTRAVENOUS
  Filled 2014-01-04 (×4): qty 0.96

## 2014-01-04 MED ORDER — FENTANYL CITRATE 0.05 MG/ML IJ SOLN
INTRAMUSCULAR | Status: DC | PRN
  Administered 2014-01-04: 25 ug via INTRAVENOUS

## 2014-01-04 MED ORDER — FENTANYL CITRATE 0.05 MG/ML IJ SOLN
INTRAMUSCULAR | Status: AC
  Filled 2014-01-04: qty 4

## 2014-01-04 MED ORDER — MIDAZOLAM HCL 10 MG/2ML IJ SOLN
INTRAMUSCULAR | Status: DC | PRN
  Administered 2014-01-04: 1 mg via INTRAVENOUS

## 2014-01-04 MED ORDER — ACETAMINOPHEN 325 MG PO TABS: 650.0000 mg | ORAL_TABLET | ORAL | Status: DC | PRN

## 2014-01-04 MED ORDER — SODIUM CHLORIDE 0.9 % IV SOLN: INTRAVENOUS | Status: DC

## 2014-01-04 MED ORDER — BUTAMBEN-TETRACAINE-BENZOCAINE 2-2-14 % EX AERO
INHALATION_SPRAY | CUTANEOUS | Status: DC | PRN
  Administered 2014-01-04: 1 via TOPICAL

## 2014-01-04 NOTE — Interval H&P Note (Signed)
History and Physical Interval Note:  01/04/2014 9:25 AM  Sara Gibson  has presented today for surgery, with the diagnosis of abnormal xray  The various methods of treatment have been discussed with the patient and family. After consideration of risks, benefits and other options for treatment, the patient has consented to  Procedure(s): ESOPHAGOGASTRODUODENOSCOPY (EGD) (N/A) as a surgical intervention .  The patient's history has been reviewed, patient examined, no change in status, stable for surgery.  I have reviewed the patient's chart and labs.  Questions were answered to the patient's satisfaction.     [name],Lindell Tussey L

## 2014-01-04 NOTE — Progress Notes (Signed)
[facility] PROGRESS NOTE  Sara Gibson ZOX:[id number] DOB: 25-Dec-1932 DOA: 12/31/2013 PCP: Ailene Ravel, MD   Interim history 80 year old female with a history of asthma presents with one-week history of cough, chest discomfort, shortness of breath, abdominal pain. The patient has had some nausea without vomiting. She had a bowel movement 2 days prior to admission. CT of the abdomen and pelvis revealed a thickened GE junction. GI was consulted. EGD was performed and revealed only hiatus hernia. No other abnormalities were noted. The patient was started on steroids. The patient's respiratory status gradually improved. Blood cultures were obtained and were negative. Troponin was negative. Assessment/Plan: Asthma exacerbation/bronchitis  - Breathing seems to be improved somewhat but not back to baseline.  - She has been initiated on duonebs - restart IV soludmedrol today - A CXR revealed only chronic changes  - O2 as needed to keep pulse ox > 88%  - ambulatory pulsox check - PT eval  - consult speech therapy-->swallow evaluation--?aspiration causing worsening asthma -Check ambulatory pulse oximetry Abdominal pain/nausea  - Continues with nausea, she reports this being ongoing for the last 2-3 weeks.  - has not had any vomiting or diarrhea.  - CT abdomen (below) with area at GE junction which may reflect inflammation or malignancy  - EGD--no abnormalities except hiatus hernia - appreciate GI, advance diet - She is anemic, however, has not reported blood in stool. Will check hemocult.  - Protonix and GI cocktail  Urinary frequency  - This is by report from patient  - UA is WNL  - Continue to monitor  Anemia, iron deficiency  - H/H decreased with fluid resuscitation, Hgb 9.7  - Monitor closely  - Overall, hemoglobin has remained stable -Continue iron supplementation -Haptoglobin and LDH do not suggest hemolysis HTN  - Home meds of losartan and amlodipine were  continued  Family Communication:   Pt at beside Disposition Plan:   Home when medically stable      Procedures/Studies: Ct Abdomen Pelvis W Contrast  12/31/2013   CLINICAL DATA:  Left-sided abdominal pain.  Nausea.  EXAM: CT ABDOMEN AND PELVIS WITH CONTRAST  TECHNIQUE: Multidetector CT imaging of the abdomen and pelvis was performed using the standard protocol following bolus administration of intravenous contrast.  CONTRAST:  80mL OMNIPAQUE IOHEXOL 300 MG/ML IV. Oral contrast was also administered.  COMPARISON:  CT ABD-PELV W/ CM dated 05/15/2011; CT ABD W/CM dated 10/28/2004; CT PELVIS W/CM dated 10/28/2004  FINDINGS: Cecal, descending and sigmoid colon diverticulosis without evidence of acute diverticulitis. Large stool burden throughout the colon. Thickening of the wall of the proximal stomach at the esophagogastric junction, not present on the prior examinations. Remainder of the stomach normal in appearance wall thickening involving the descending duodenum. Remainder of the small bowel normal in appearance. No ascites.  Small cysts in the posterior segment right lobe of liver, unchanged; no significant abnormality involving the liver. Normal spleen, pancreas, and gallbladder. No biliary ductal dilation. Bilateral adrenal enlargement with nodularity involving the left adrenal gland, unchanged, the left adrenal measuring approximately 3.6 x 2.6 cm. Cortical cysts involving both kidneys which are otherwise unremarkable. Extensive aortoiliofemoral and visceral artery atherosclerosis without aneurysm. No significant lymphadenopathy.  Uterus not visualized and either surgically absent or markedly atrophic. No adnexal masses or free pelvic fluid. Numerous phleboliths in the pelvis. Urinary bladder decompressed and unremarkable.  Bone window images demonstrate severe degenerative changes involving both hips, thoracolumbar scoliosis convex right, severe degenerative disc disease and spondylosis at every level  from L2-3  through L5-S1, and lower thoracic spondylosis. Visualized lung bases clear. Bochdalek's hernia involving the posterior right hemidiaphragm with extrusion of intra-abdominal fat, unchanged. Heart size upper normal slightly enlarged but stable.  IMPRESSION: 1. Thickening of the wall of the proximal stomach beginning at the esophagogastric junction, a new finding since the prior examinations. While this might reflect focal inflammation, malignancy is not excluded based on the appearance. Upper endoscopy may be helpful in further evaluation. 2. Thickening of the wall of the descending duodenum which could also be evaluated at upper endoscopy. 3. Stable adenomatous adrenal hyperplasia. 4. Osseous findings and other incidental findings as above.   Electronically Signed   By: Hulan Saashomas  Lawrence M.D.   On: 12/31/2013 20:21   Dg Chest Port 1 View  12/31/2013   CLINICAL DATA:  Chest pain  EXAM: PORTABLE CHEST - 1 VIEW  COMPARISON:  01/17/2012  FINDINGS: Cardiac shadow is stable. Chronic blunting of left costophrenic angle is noted. No focal infiltrate or sizable effusion is seen. No pneumothorax is noted. No acute bony abnormality is seen.  IMPRESSION: Chronic changes on the left.  No acute abnormality noted.   Electronically Signed   By: Alcide CleverMark  Lukens M.D.   On: 12/31/2013 16:10         Subjective: Patient states that she is hungry. She feels frustrated she has not been able to eat this morning. She feels that her breathing and coughing are slowly improving. Denies any fevers, chills, chest discomfort, shortness of breath, abdominal pain. No reports of diarrhea or vomiting.. No dysuria  Objective: Filed Vitals:   01/04/14 0948 01/04/14 0950 01/04/14 1000 01/04/14 1005  BP:  124/72 123/57 111/58  Pulse:  79 78 93  Temp: 99.1 F (37.3 C)     TempSrc: Oral     Resp:  13 18 21   Height:      Weight:      SpO2:  98% 99% 98%    Intake/Output Summary (Last 24 hours) at 01/04/14 1100 Last data  filed at 01/04/14 0700  Gross per 24 hour  Intake    900 ml  Output      0 ml  Net    900 ml   Weight change: -1.497 kg (-3 lb 4.8 oz) Exam:   General:  Pt is alert, follows commands appropriately, not in acute distress  HEENT: No icterus, No thrush,  [place]/AT  Cardiovascular: RRR, S1/S2, no rubs, no gallops  Respiratory: Scattered bilateral wheezes. Good air movement. Diminished breath sounds left base.  Abdomen: Soft/+BS, mild epigastric tenderness without any rebound, non distended, no guarding  Extremities: No edema, No lymphangitis, No petechiae, No rashes, no synovitis  Data Reviewed: Basic Metabolic Panel:  Recent Labs Lab 12/31/13 1529 01/01/14 0339 01/03/14 0830 01/04/14 0703  NA 141 143 142 140  K 4.9 4.6 3.6* 3.8  CL 102 106 102 103  CO2 24 21 26 24   GLUCOSE 161* 149* 75 79  BUN 31* 22 10 10   CREATININE 1.01 0.88 0.74 0.64  CALCIUM 9.3 8.6 9.0 9.0   Liver Function Tests:  Recent Labs Lab 12/31/13 1529  AST 34  ALT 22  ALKPHOS 75  BILITOT <0.2*  PROT 7.2  ALBUMIN 3.4*    Recent Labs Lab 12/31/13 1529  LIPASE 48   No results found for this basename: AMMONIA,  in the last 168 hours CBC:  Recent Labs Lab 12/31/13 1529 01/01/14 0339 01/02/14 0411 01/03/14 0830 01/04/14 0703  WBC 9.3 10.1  --  9.5  7.9  NEUTROABS 7.9*  --   --   --   --   HGB 11.1* 9.7* 8.9* 10.5* 10.4*  HCT 33.9* 30.5* 27.7* 32.6* 31.8*  MCV 95.0 95.9  --  94.8 93.3  PLT 390 362  --  386 366   Cardiac Enzymes:  Recent Labs Lab 12/31/13 1529  TROPONINI <0.30   BNP: No components found with this basename: POCBNP,  CBG: No results found for this basename: GLUCAP,  in the last 168 hours  Recent Results (from the past 240 hour(s))  CULTURE, BLOOD (ROUTINE X 2)     Status: None   Collection Time    12/31/13  3:41 PM      Result Value Ref Range Status   Specimen Description BLOOD HAND LEFT   Final   Special Requests BOTTLES DRAWN AEROBIC AND ANAEROBIC 5CC   Final    Culture  Setup Time     Final   Value: 12/31/2013 20:06     Performed at [facility]   Culture     Final   Value:        BLOOD CULTURE RECEIVED NO GROWTH TO DATE CULTURE WILL BE HELD FOR 5 DAYS BEFORE ISSUING A FINAL NEGATIVE REPORT     Performed at [facility]   Report Status PENDING   Incomplete  CULTURE, BLOOD (ROUTINE X 2)     Status: None   Collection Time    12/31/13  4:31 PM      Result Value Ref Range Status   Specimen Description BLOOD HAND RIGHT   Final   Special Requests BOTTLES DRAWN AEROBIC ONLY 5CC   Final   Culture  Setup Time     Final   Value: 12/31/2013 21:52     Performed at [facility]   Culture     Final   Value:        BLOOD CULTURE RECEIVED NO GROWTH TO DATE CULTURE WILL BE HELD FOR 5 DAYS BEFORE ISSUING A FINAL NEGATIVE REPORT     Performed at [facility]   Report Status PENDING   Incomplete     Scheduled Meds: . amLODipine  10 mg Oral Daily  . atorvastatin  20 mg Oral Daily  . dextromethorphan-guaiFENesin  1 tablet Oral BID  . ferrous sulfate  325 mg Oral BID WC  . heparin  5,000 Units Subcutaneous 3 times per day  . ipratropium-albuterol  3 mL Nebulization Q6H  . losartan  100 mg Oral Daily  . methylPREDNISolone (SOLU-MEDROL) injection  60 mg Intravenous Q12H  . pantoprazole  40 mg Oral BID   Continuous Infusions: . sodium chloride 75 mL/hr at 01/04/14 0031  . sodium chloride       Jakari Sada, DO  [facility] Pager [phone]  If 7PM-7AM, please contact night-coverage [email or web address] Password [email or web address] 01/04/2014, 11:00 AM   LOS: 4 days

## 2014-01-04 NOTE — Op Note (Signed)
[facility] [address] [place], [id number]   ENDOSCOPY PROCEDURE REPORT  PATIENT: Sara PheasantWhitley, [name]  MR#: [id number] BIRTHDATE: 1933-03-05 , 80  yrs. old GENDER: Female ENDOSCOPIST:Ashli Selders Randa EvensEdwards, MD REFERRED BY:  [facility]. PCP: Dr. Nathanial RancherHamrick PROCEDURE DATE:  01/04/2014 PROCEDURE:      EGD ASA CLASS:     class III INDICATIONS:   abnormal CT scan suggesting thickening at the GE junction MEDICATION:    fentanyl 25 mcg, versed 1 mg IV TOPICAL ANESTHETIC:    cetacaine spray  DESCRIPTION OF PROCEDURE:   The Pentax adult endoscope inserted into the esophagus with swallowing. The entire esophageal lumen was normal. There was small hiatal hernia in the GE junction was normal in the forward and retroflex view. In the area of abnormality suggested on the CT scan, the endoscopic exam was completely normal. The remainder of the stomach was examined and was normal. The duodenum including the bulb and 2nd portion was normal. The scope is withdrawn in the patient tolerated procedure well. There were no immediate complications.     COMPLICATIONS: None  ENDOSCOPIC IMPRESSION: 1. Abnormal CT Scan. EGD is not reveal any obvious abnormalities other than small hiatal hernia  RECOMMENDATIONS: 1. would resume diet. Please call us back as needed.    _______________________________ Rosalie DoctoreSignedCarman Ching:  Anntoinette Haefele, MD 01/04/2014 9:52 AM  CC: Dr Nathanial RancherHamrick

## 2014-01-04 NOTE — Evaluation (Signed)
Physical Therapy Evaluation Patient Details Name: Sara Gibson MRN: [id number] DOB: December 06, 1932 Today's Date: 01/04/2014 Time: 4098-1191 PT Time Calculation (min): 19 min  PT Assessment / Plan / Recommendation History of Present Illness  Sara Gibson is a 80 y.o. female who presents to the ED with Abd pain and productive cough for 1 week history.  No V/D but does have nausea.  Epigastric pain, worse with eating.  Also has productive cough for the past 1 week.  No fevers no chills.  Is SOB.  Clinical Impression  Patient presents with decreased independence with mobility due to deficits listed below and will benefit from skilled PT in the acute setting to allow return home with intermittent assist.  Depending on progress may need STSNF versus HHPT.  Patient able to move without assist, but fatigues quickly with HR into 140's.    PT Assessment  Patient needs continued PT services    Follow Up Recommendations  Home health PT;SNF    Does the patient have the potential to tolerate intense rehabilitation    N/A  Barriers to Discharge Decreased caregiver support has aide all morning 7 days/weel and sometimes in afternoon    Equipment Recommendations  None recommended by PT    Recommendations for Other Services   OT  Frequency Min 3X/week    Precautions / Restrictions Precautions Precautions: Fall Precaution Comments: steady with walker, but fatigues   Pertinent Vitals/Pain C/o 8/10 pain in feet and legs with ambulation      Mobility  Bed Mobility Overal bed mobility: Needs Assistance Bed Mobility: Supine to Sit;Sit to Supine Supine to sit: HOB elevated;Supervision Sit to supine: HOB elevated;Supervision General bed mobility comments: for satety with cues for positioning; actually able to  Transfers Overall transfer level: Needs assistance Equipment used: Rolling walker (2 wheeled) Transfers: Sit to/from Stand Sit to Stand: Min guard General transfer comment: easily fatigues,  assist for safety Ambulation/Gait Ambulation/Gait assistance: Supervision;Min guard Ambulation Distance (Feet): 90 Feet Assistive device: Rolling walker (2 wheeled) Gait Pattern/deviations: Step-through pattern;Trunk flexed;Shuffle General Gait Details: flexed posture, pt reports is premorbid; occasional minguard for safety with turns, etc        PT Diagnosis: Difficulty walking;Generalized weakness  PT Problem List: Decreased strength;Decreased activity tolerance;Decreased balance;Decreased mobility PT Treatment Interventions: DME instruction;Therapeutic exercise;Gait training;Balance training;Functional mobility training;Stair training;Therapeutic activities;Patient/family education     PT Goals(Current goals can be found in the care plan section) Acute Rehab PT Goals Patient Stated Goal: To get strength back PT Goal Formulation: With patient Time For Goal Achievement: 01/18/14 Potential to Achieve Goals: Good  Visit Information  Last PT Received On: 01/04/14 Assistance Needed: +1 History of Present Illness: Sara Gibson is a 80 y.o. female who presents to the ED with Abd pain and productive cough for 1 week history.  No V/D but does have nausea.  Epigastric pain, worse with eating.  Also has productive cough for the past 1 week.  No fevers no chills.  Is SOB.       Prior Functioning  Home Living Family/patient expects to be discharged to:: Private residence Living Arrangements: Alone Available Help at Discharge: Personal care attendant Type of Home: House Home Access: Stairs to enter [employer] of Steps: 2 Home Layout: One level Home Equipment: Walker - 2 wheels;Shower seat Prior Function Level of Independence: Independent with assistive device(s) Communication Communication: No difficulties    Cognition  Cognition Arousal/Alertness: Awake/alert Behavior During Therapy: WFL for tasks assessed/performed Overall Cognitive Status: Within Functional Limits  for tasks  assessed    Extremity/Trunk Assessment Lower Extremity Assessment Lower Extremity Assessment: Generalized weakness   Balance Balance Overall balance assessment: Needs assistance Standing balance support: Bilateral upper extremity supported Standing balance-Leahy Scale: Poor Standing balance comment: UE assist needed for balance  End of Session PT - End of Session Equipment Utilized During Treatment: Gait belt Activity Tolerance: Patient limited by fatigue;Treatment limited secondary to medical complications (Comment) (HR up to 145 with ambulation) Patient left: in bed;with call bell/phone within reach;with bed alarm set  GP     Gi Or NormanWYNN,CYNDI 01/04/2014, 4:20 PM [place]yndi Dardan [place], [place] [phone] 01/04/2014

## 2014-01-04 NOTE — Evaluation (Signed)
Clinical/Bedside Swallow Evaluation Patient Details  Name: Sara Gibson MRN: [id number] Date of Birth: 1932-12-24  Today's Date: 01/04/2014 Time: [id number] SLP Time Calculation (min): 16 min  Past Medical History:  Past Medical History  Diagnosis Date  . Asthma    Past Surgical History: History reviewed. No pertinent past surgical history. HPI:  80 year old female with a history of asthma presents with one-week history of cough, chest discomfort, shortness of breath, abdominal pain. The patient has had some nausea without vomiting. She had a bowel movement 2 days prior to admission. CT of the abdomen and pelvis revealed a thickened GE junction. GI was consulted. EGD was performed and revealed only hiatus hernia. No other abnormalities were noted. The patient was started on steroids. The patient's respiratory status gradually improved. MD concerned that aspiration may be causing worsening of asthma.    Assessment / Plan / Recommendation Clinical Impression  Pt demonstrates adequate swallow function. There is no evidence of aspiration with thin liquids and pt is able to chew regular solids despite missing dentition. He respiratory function remained stable throughout observation with POs. She does report that her food "doesn't sit right," but otherwise does not exhibit any signs of esophageal dysphagia (belching, regurgitation, late cough, etc). Recommend pt continue a regular diet with thin liquids. No SLP f/u needed.     Aspiration Risk  Mild    Diet Recommendation Regular;Thin liquid   Liquid Administration via: Cup;Straw Medication Administration: Whole meds with liquid Supervision: Patient able to self feed Postural Changes and/or Swallow Maneuvers: Seated upright 90 degrees;Out of bed for meals    Other  Recommendations Oral Care Recommendations: Oral care BID   Follow Up Recommendations  None    Frequency and Duration        Pertinent Vitals/Pain NA    SLP Swallow Goals      Swallow Study Prior Functional Status       General HPI: 80 year old female with a history of asthma presents with one-week history of cough, chest discomfort, shortness of breath, abdominal pain. The patient has had some nausea without vomiting. She had a bowel movement 2 days prior to admission. CT of the abdomen and pelvis revealed a thickened GE junction. GI was consulted. EGD was performed and revealed only hiatus hernia. No other abnormalities were noted. The patient was started on steroids. The patient's respiratory status gradually improved. MD concerned that aspiration may be causing worsening of asthma.  Type of Study: Bedside swallow evaluation Diet Prior to this Study: Regular;Thin liquids Temperature Spikes Noted: No Respiratory Status: Room air History of Recent Intubation: No Behavior/Cognition: Alert;Cooperative;Pleasant mood Oral Cavity - Dentition:  (partial top, no bottom dentures) Self-Feeding Abilities: Able to feed self Patient Positioning: Upright in bed Baseline Vocal Quality: Clear Volitional Cough: Strong Volitional Swallow: Able to elicit    Oral/Motor/Sensory Function Overall Oral Motor/Sensory Function: Appears within functional limits for tasks assessed   Ice Chips     Thin Liquid Thin Liquid: Within functional limits Presentation: Cup;Straw;Self Fed    Nectar Thick Nectar Thick Liquid: Not tested   Honey Thick Honey Thick Liquid: Not tested   Puree Puree: Within functional limits   Solid   GO    Solid: Within functional limits      [facility] Schumpert Medical CenterBonnie Shayla Heming, MA CCC-SLP [phone]  Claudine MoutonDeBlois, Martesha Niedermeier Caroline 01/04/2014,3:40 PM

## 2014-01-04 NOTE — H&P (View-Only) (Signed)
Subjective:   HPI  The patient is a 80 year old female who was admitted to the hospital yesterday with complaints of a productive cough for a week. She was diagnosed with acute bronchitis/asthma exacerbation. She was having some upper abdominal discomfort as well. A CT scan of the abdomen was done which shows thickening in the proximal stomach beginning at the esophagogastric junction region as well as thickening in the descending duodenum. She denies heartburn. She denies vomiting but does have some nausea.  Review of Systems She is complaining of shortness of breath.  Past Medical History  Diagnosis Date  . Asthma    History reviewed. No pertinent past surgical history. History   Social History  . Marital Status: Divorced    Spouse Name: N/A    Number of Children: N/A  . Years of Education: N/A   Occupational History  . Not on file.   Social History Main Topics  . Smoking status: Former Games developer  . Smokeless tobacco: Never Used  . Alcohol Use: No  . Drug Use: No  . Sexual Activity: Not on file   Other Topics Concern  . Not on file   Social History Narrative  . No narrative on file   family history is not on file. Current facility-administered medications:0.9 %  sodium chloride infusion, , Intravenous, Continuous, Hillary Bow, DO, Last Rate: 75 mL/hr at 12/31/13 2259;  albuterol (PROVENTIL) (2.5 MG/3ML) 0.083% nebulizer solution 2.5 mg, 2.5 mg, Inhalation, Q4H PRN, Hillary Bow, DO;  amLODipine (NORVASC) tablet 10 mg, 10 mg, Oral, Daily, Jared M Gardner, DO, 10 mg at 01/01/14 9528 atorvastatin (LIPITOR) tablet 20 mg, 20 mg, Oral, Daily, Jared M Gardner, DO, 20 mg at 01/01/14 0930;  gi cocktail (Maalox,Lidocaine,Donnatal), 30 mL, Oral, TID PRN, Hillary Bow, DO;  heparin injection 5,000 Units, 5,000 Units, Subcutaneous, 3 times per day, Jared M Gardner, DO;  ipratropium-albuterol (DUONEB) 0.5-2.5 (3) MG/3ML nebulizer solution 3 mL, 3 mL, Nebulization, BID, Jared M  Gardner, DO losartan (COZAAR) tablet 100 mg, 100 mg, Oral, Daily, Jared M Gardner, DO, 100 mg at 01/01/14 4132;  ondansetron [facility]) injection 4 mg, 4 mg, Intravenous, Q6H PRN, Hillary Bow, DO;  pantoprazole (PROTONIX) EC tablet 40 mg, 40 mg, Oral, BID, Jared M Gardner, DO, 40 mg at 01/01/14 4401;  predniSONE (DELTASONE) tablet 60 mg, 60 mg, Oral, Q breakfast, Jared M Gardner, DO, 60 mg at 01/01/14 0272 No Known Allergies   Objective:     BP 123/61  Pulse 100  Temp(Src) 98.4 F (36.9 C) (Oral)  Resp 19  Ht 5\' 4"  (1.626 m)  Wt 53.978 kg (119 lb)  BMI 20.42 kg/m2  SpO2 88%  She is short of breath  Heart regular rhythm no murmurs  Lungs bilateral rhonchi and wheezing  Abdomen is soft and nontender without obvious masses  Laboratory No components found with this basename: d1      Assessment:     Abnormal CT scan showing thickening of the proximal stomach beginning at the region of the esophagogastric junction as well as thickening in the descending duodenum.      Plan:     For now I would recommend treating her GI tract with PPI therapy. At some point she will probably need an upper endoscopy to evaluate the findings on the upper GI tract to determine whether or not these are inflammatory or something worse. At the present time however I would not feel comfortable doing endoscopy with sedation because of her pulmonary  status. Continue to treat pulmonary symptoms. Lab Results  Component Value Date   HGB 9.7* 01/01/2014   HGB 11.1* 12/31/2013   HCT 30.5* 01/01/2014   HCT 33.9* 12/31/2013   ALKPHOS 75 12/31/2013   AST 34 12/31/2013   ALT 22 12/31/2013

## 2014-01-05 ENCOUNTER — Encounter (HOSPITAL_COMMUNITY): Payer: Self-pay | Admitting: Gastroenterology

## 2014-01-05 DIAGNOSIS — I10 Essential (primary) hypertension: Secondary | ICD-10-CM

## 2014-01-05 DIAGNOSIS — R109 Unspecified abdominal pain: Secondary | ICD-10-CM | POA: Diagnosis not present

## 2014-01-05 DIAGNOSIS — J45901 Unspecified asthma with (acute) exacerbation: Secondary | ICD-10-CM | POA: Diagnosis not present

## 2014-01-05 DIAGNOSIS — D509 Iron deficiency anemia, unspecified: Secondary | ICD-10-CM | POA: Diagnosis not present

## 2014-01-05 LAB — BASIC METABOLIC PANEL
BUN: 11 mg/dL (ref 6–23)
CHLORIDE: 103 meq/L (ref 96–112)
CO2: 22 mEq/L (ref 19–32)
CREATININE: 0.64 mg/dL (ref 0.50–1.10)
Calcium: 9.2 mg/dL (ref 8.4–10.5)
GFR, EST NON AFRICAN AMERICAN: 83 mL/min — AB (ref >90)
Glucose, Bld: 144 mg/dL — ABNORMAL HIGH (ref 70–99)
Potassium: 4.6 mEq/L (ref 3.7–5.3)
Sodium: 139 mEq/L (ref 137–147)

## 2014-01-05 LAB — CBC
HCT: 32.6 % — ABNORMAL LOW (ref 36.0–46.0)
Hemoglobin: 10.8 g/dL — ABNORMAL LOW (ref 12.0–15.0)
MCH: 30.7 pg (ref 26.0–34.0)
MCHC: 33.1 g/dL (ref 30.0–36.0)
MCV: 92.6 fL (ref 78.0–100.0)
PLATELETS: 384 10*3/uL (ref 150–400)
RBC: 3.52 MIL/uL — ABNORMAL LOW (ref 3.87–5.11)
RDW: 15.2 % (ref 11.5–15.5)
WBC: 7.5 10*3/uL (ref 4.0–10.5)

## 2014-01-05 LAB — OCCULT BLOOD X 1 CARD TO LAB, STOOL: Fecal Occult Bld: POSITIVE — AB

## 2014-01-05 LAB — GLUCOSE, CAPILLARY: Glucose-Capillary: 143 mg/dL — ABNORMAL HIGH (ref 70–99)

## 2014-01-05 MED ORDER — FERROUS SULFATE 325 (65 FE) MG PO TABS: 325.0000 mg | ORAL_TABLET | Freq: Two times a day (BID) | ORAL | Status: AC

## 2014-01-05 MED ORDER — PREDNISONE 50 MG PO TABS
50.0000 mg | ORAL_TABLET | Freq: Every day | ORAL | Status: DC
  Administered 2014-01-05: 50 mg via ORAL
  Filled 2014-01-05 (×2): qty 1

## 2014-01-05 MED ORDER — IPRATROPIUM-ALBUTEROL 0.5-2.5 (3) MG/3ML IN SOLN
3.0000 mL | Freq: Three times a day (TID) | RESPIRATORY_TRACT | Status: DC
  Administered 2014-01-05: 3 mL via RESPIRATORY_TRACT
  Filled 2014-01-05: qty 3

## 2014-01-05 MED ORDER — PREDNISONE 10 MG PO TABS: 50.0000 mg | ORAL_TABLET | Freq: Every day | ORAL | Status: AC

## 2014-01-05 MED ORDER — PANTOPRAZOLE SODIUM 40 MG PO TBEC: 40.0000 mg | DELAYED_RELEASE_TABLET | Freq: Two times a day (BID) | ORAL | Status: AC

## 2014-01-05 NOTE — Progress Notes (Signed)
   CARE MANAGEMENT NOTE 01/05/2014  Patient:  Sara Gibson, Sara Gibson   Account Number:  [id number]  Date Initiated:  01/05/2014  Documentation initiated by:  Lizabeth Leyden  Subjective/Objective Assessment:   admitted with asthma exacerbation     Action/Plan:   home health PT   Anticipated DC Date:  01/05/2014   Anticipated DC Plan:  [place]  CM consult      [facility] Choice  HOME HEALTH   Choice offered to / List presented to:          [facility] arranged  HH-2 PT      [place].   Status of service:  Completed, signed off Medicare Important Message given?   (If response is "NO", the following Medicare IM given date fields will be blank) Date Medicare IM given:   Date Additional Medicare IM given:    Discharge Disposition:  [place]  Per UR Regulation:    If discussed at Long Length of Stay Meetings, dates discussed:    Comments:  01/05/2014  [place], [place] CM referral: home health PT  Met with patient regarding home health agency. Patient unable to assist with selection of agency. Caregiver Gaspar Skeeters [phone] called to discuss discharge planning and home health agency.  [facility]  [facility] called with the referral.

## 2014-01-05 NOTE — Progress Notes (Signed)
Attempted to check patient's O2 sat while ambulating prior to discharge. Patient refused to ambulate at this time. Assisted to recliner, using walker, while on RA - pt's O2 sat 95%. No respiratory distress noted.  Kathlene NovemberEckelmann, Lestine Rahe [place]

## 2014-01-05 NOTE — Discharge Summary (Signed)
Physician Discharge Summary  Kala Ambriz ZOX:[id number] DOB: [birth year] DOA: 12/31/2013  PCP: Ailene Ravel, MD  Admit date: 12/31/2013 Discharge date: 01/05/2014  Recommendations for Outpatient Follow-up:  1. Pt will need to follow up with PCP in 2 weeks post discharge 2. Please obtain BMP to evaluate electrolytes and kidney function 3. Please also check CBC to evaluate Hg and Hct levels   Discharge Diagnoses:  Principal Problem:   Asthma exacerbation Active Problems:   Abdominal pain   Anemia, iron deficiency   Unspecified essential hypertension Asthma exacerbation/bronchitis  - Breathing seems to be improved somewhat but not back to baseline.  - She has been initiated on duonebs  - restarted IV soludmedrol--patient experienced significant clinical improvement with no further wheezing - CXR revealed only chronic changes  - O2 as needed to keep pulse ox > 88%  - ambulatory pulsox check  - PT eval--home health PT - consult speech therapy-->swallow evaluation--recommended regular diet  -Check ambulatory pulse oximetry--no oxygen desaturation  -Home with prednisone 50 mg and decrease by 10 mg daily  Abdominal pain/nausea  - Continues with nausea, she reports this being ongoing for the last 2-3 weeks. - has not had any vomiting or diarrhea.  - CT abdomen (below) with area at GE junction which may reflect inflammation or malignancy  - EGD--no abnormalities except hiatus hernia  - appreciate GI, advance diet  - She is anemic, however, has not reported blood in stool. Will check hemocult.  - Protonix  twice a day  Urinary frequency  - This is by report from patient  - UA is WNL  - Continue to monitor--improved/stable  Anemia, iron deficiency  - H/H decreased with fluid resuscitation, Hgb 9.7  - Monitor closely  - Overall, hemoglobin has remained stable  -Continue iron supplementation  -Haptoglobin and LDH do not suggest hemolysis  HTN  - Home meds of losartan and amlodipine  were continued  Family Communication: spoke with care giver Trixie Deis Disposition Plan: Home when medically stable   Discharge Condition: stable  Disposition: home  Diet:heart healthy Wt Readings from Last 3 Encounters:  01/04/14 55.157 kg (121 lb 9.6 oz)  01/04/14 55.157 kg (121 lb 9.6 oz)    History of present illness:   80 year old female with a history of asthma presents with one-week history of cough, chest discomfort, shortness of breath, abdominal pain. The patient has had some nausea without vomiting. She had a bowel movement 2 days prior to admission. CT of the abdomen and pelvis revealed a thickened GE junction. GI was consulted. EGD was performed and revealed only hiatus hernia. No other abnormalities were noted. The patient was started on steroids. The patient's respiratory status gradually improved. Blood cultures were obtained and were negative. Troponin was negative. The patient did not significantly improve with oral prednisone. She was started on intravenous steroids. She experienced clinical improvement. She was weaned to oral steroids. She'll be discharged home with a five-day wean. EGD was performed by Dr. Randa Evens. There were no significant findings. She was noted to have iron deficiency anemia. The patient was given oral iron supplementation.    Discharge Exam: Filed Vitals:   01/05/14 0905  BP: 123/68  Pulse: 62  Temp: 98.5 F (36.9 C)  Resp: 17   Filed Vitals:   01/04/14 2054 01/04/14 2156 01/05/14 0419 01/05/14 0905  BP: 131/77  126/65 123/68  Pulse: 87 90 77 62  Temp: 98.3 F (36.8 C)  98.9 F (37.2 C) 98.5 F (36.9 C)  TempSrc:  Oral  Oral   Resp: 18 18 16 17   Height:      Weight: 55.157 kg (121 lb 9.6 oz)     SpO2: 96%  96% 96%   General: alert and awake, NAD, pleasant, cooperative Cardiovascular: RRR, no rub, no gallop, no S3 Respiratory: diminished breath sounds at the bases. No wheezing. Good air movement.  Abdomen:soft, nontender,  nondistended, positive bowel sounds Extremities: No edema, No lymphangitis, no petechiae  Discharge Instructions      Discharge Orders   Future Orders Complete By Expires   Diet - low sodium heart healthy  As directed    Increase activity slowly  As directed        Medication List         alendronate 70 MG tablet  Commonly known as:  FOSAMAX  Take 70 mg by mouth once a week.     amLODipine 10 MG tablet  Commonly known as:  NORVASC  Take 10 mg by mouth daily.     atorvastatin 20 MG tablet  Commonly known as:  LIPITOR  Take 20 mg by mouth daily.     ferrous sulfate 325 (65 FE) MG tablet  Take 1 tablet (325 mg total) by mouth 2 (two) times daily with a meal.     losartan 100 MG tablet  Commonly known as:  COZAAR  Take 100 mg by mouth daily.     pantoprazole 40 MG tablet  Commonly known as:  PROTONIX  Take 1 tablet (40 mg total) by mouth 2 (two) times daily.     predniSONE 10 MG tablet  Commonly known as:  DELTASONE  Take 5 tablets (50 mg total) by mouth daily with breakfast. Start 01/06/14 and decrease by one tablet daily     PROVENTIL HFA 108 (90 BASE) MCG/ACT inhaler  Generic drug:  albuterol  Inhale 2 puffs into the lungs every 4 (four) hours as needed for shortness of breath.         The results of significant diagnostics from this hospitalization (including imaging, microbiology, ancillary and laboratory) are listed below for reference.    Significant Diagnostic Studies: Ct Abdomen Pelvis W Contrast  12/31/2013   CLINICAL DATA:  Left-sided abdominal pain.  Nausea.  EXAM: CT ABDOMEN AND PELVIS WITH CONTRAST  TECHNIQUE: Multidetector CT imaging of the abdomen and pelvis was performed using the standard protocol following bolus administration of intravenous contrast.  CONTRAST:  80mL OMNIPAQUE IOHEXOL 300 MG/ML IV. Oral contrast was also administered.  COMPARISON:  CT ABD-PELV W/ CM dated 05/15/2011; CT ABD W/CM dated 10/28/2004; CT PELVIS W/CM dated 10/28/2004   FINDINGS: Cecal, descending and sigmoid colon diverticulosis without evidence of acute diverticulitis. Large stool burden throughout the colon. Thickening of the wall of the proximal stomach at the esophagogastric junction, not present on the prior examinations. Remainder of the stomach normal in appearance wall thickening involving the descending duodenum. Remainder of the small bowel normal in appearance. No ascites.  Small cysts in the posterior segment right lobe of liver, unchanged; no significant abnormality involving the liver. Normal spleen, pancreas, and gallbladder. No biliary ductal dilation. Bilateral adrenal enlargement with nodularity involving the left adrenal gland, unchanged, the left adrenal measuring approximately 3.6 x 2.6 cm. Cortical cysts involving both kidneys which are otherwise unremarkable. Extensive aortoiliofemoral and visceral artery atherosclerosis without aneurysm. No significant lymphadenopathy.  Uterus not visualized and either surgically absent or markedly atrophic. No adnexal masses or free pelvic fluid. Numerous phleboliths in the pelvis. Urinary bladder  decompressed and unremarkable.  Bone window images demonstrate severe degenerative changes involving both hips, thoracolumbar scoliosis convex right, severe degenerative disc disease and spondylosis at every level from L2-3 through L5-S1, and lower thoracic spondylosis. Visualized lung bases clear. Bochdalek's hernia involving the posterior right hemidiaphragm with extrusion of intra-abdominal fat, unchanged. Heart size upper normal slightly enlarged but stable.  IMPRESSION: 1. Thickening of the wall of the proximal stomach beginning at the esophagogastric junction, a new finding since the prior examinations. While this might reflect focal inflammation, malignancy is not excluded based on the appearance. Upper endoscopy may be helpful in further evaluation. 2. Thickening of the wall of the descending duodenum which could also be  evaluated at upper endoscopy. 3. Stable adenomatous adrenal hyperplasia. 4. Osseous findings and other incidental findings as above.   Electronically Signed   By: Hulan Saashomas  Lawrence M.D.   On: 12/31/2013 20:21   Dg Chest Port 1 View  12/31/2013   CLINICAL DATA:  Chest pain  EXAM: PORTABLE CHEST - 1 VIEW  COMPARISON:  01/17/2012  FINDINGS: Cardiac shadow is stable. Chronic blunting of left costophrenic angle is noted. No focal infiltrate or sizable effusion is seen. No pneumothorax is noted. No acute bony abnormality is seen.  IMPRESSION: Chronic changes on the left.  No acute abnormality noted.   Electronically Signed   By: Alcide CleverMark  Lukens M.D.   On: 12/31/2013 16:10     Microbiology: Recent Results (from the past 240 hour(s))  CULTURE, BLOOD (ROUTINE X 2)     Status: None   Collection Time    12/31/13  3:41 PM      Result Value Ref Range Status   Specimen Description BLOOD HAND LEFT   Final   Special Requests BOTTLES DRAWN AEROBIC AND ANAEROBIC 5CC   Final   Culture  Setup Time     Final   Value: 12/31/2013 20:06     Performed at [facility]   Culture     Final   Value:        BLOOD CULTURE RECEIVED NO GROWTH TO DATE CULTURE WILL BE HELD FOR 5 DAYS BEFORE ISSUING A FINAL NEGATIVE REPORT     Performed at [facility]   Report Status PENDING   Incomplete  CULTURE, BLOOD (ROUTINE X 2)     Status: None   Collection Time    12/31/13  4:31 PM      Result Value Ref Range Status   Specimen Description BLOOD HAND RIGHT   Final   Special Requests BOTTLES DRAWN AEROBIC ONLY 5CC   Final   Culture  Setup Time     Final   Value: 12/31/2013 21:52     Performed at [facility]   Culture     Final   Value:        BLOOD CULTURE RECEIVED NO GROWTH TO DATE CULTURE WILL BE HELD FOR 5 DAYS BEFORE ISSUING A FINAL NEGATIVE REPORT     Performed at [facility]   Report Status PENDING   Incomplete     Labs: Basic Metabolic Panel:  Recent Labs Lab 12/31/13 1529  01/01/14 0339 01/03/14 0830 01/04/14 0703 01/05/14 0720  NA 141 143 142 140 139  K 4.9 4.6 3.6* 3.8 4.6  CL 102 106 102 103 103  CO2 24 21 26 24 22   GLUCOSE 161* 149* 75 79 144*  BUN 31* 22 10 10 11   CREATININE 1.01 0.88 0.74 0.64 0.64  CALCIUM 9.3 8.6 9.0  9.0 9.2   Liver Function Tests:  Recent Labs Lab 12/31/13 1529  AST 34  ALT 22  ALKPHOS 75  BILITOT <0.2*  PROT 7.2  ALBUMIN 3.4*    Recent Labs Lab 12/31/13 1529  LIPASE 48   No results found for this basename: AMMONIA,  in the last 168 hours CBC:  Recent Labs Lab 12/31/13 1529 01/01/14 0339 01/02/14 0411 01/03/14 0830 01/04/14 0703 01/05/14 0720  WBC 9.3 10.1  --  9.5 7.9 7.5  NEUTROABS 7.9*  --   --   --   --   --   HGB 11.1* 9.7* 8.9* 10.5* 10.4* 10.8*  HCT 33.9* 30.5* 27.7* 32.6* 31.8* 32.6*  MCV 95.0 95.9  --  94.8 93.3 92.6  PLT 390 362  --  386 366 384   Cardiac Enzymes:  Recent Labs Lab 12/31/13 1529  TROPONINI <0.30   BNP: No components found with this basename: POCBNP,  CBG:  Recent Labs Lab 01/05/14 0754  GLUCAP 143*    Time coordinating discharge:  Greater than 30 minutes  Signed:  Taiki Buckwalter, DO [facility] Pager: [phone] 01/05/2014, 11:13 AM

## 2014-01-05 NOTE — Progress Notes (Signed)
Pt to be discharged to home. Telemetry box #22 removed and returned to nurse's station this morning. PIV removed. D/C instructions reviewed with patient and caregiver Margaretha GlassingLoretta prior to discharge. [facility] to follow up with patient this week.  Kathlene NovemberEckelmann, Carrianne Hyun [place]

## 2014-01-05 NOTE — Plan of Care (Signed)
Problem: Phase I Progression Outcomes Goal: Flu/PneumoVaccines if indicated Outcome: Not Applicable Date Met:  66/06/00 Already vaccinated.

## 2014-01-06 LAB — CULTURE, BLOOD (ROUTINE X 2)
Culture: NO GROWTH
Culture: NO GROWTH

## 2014-01-08 DIAGNOSIS — I10 Essential (primary) hypertension: Secondary | ICD-10-CM | POA: Diagnosis not present

## 2014-01-08 DIAGNOSIS — D649 Anemia, unspecified: Secondary | ICD-10-CM | POA: Diagnosis not present

## 2014-01-08 DIAGNOSIS — Z5189 Encounter for other specified aftercare: Secondary | ICD-10-CM | POA: Diagnosis not present

## 2014-01-08 DIAGNOSIS — J45901 Unspecified asthma with (acute) exacerbation: Secondary | ICD-10-CM | POA: Diagnosis not present

## 2014-01-11 DIAGNOSIS — Z5189 Encounter for other specified aftercare: Secondary | ICD-10-CM | POA: Diagnosis not present

## 2014-01-11 DIAGNOSIS — D649 Anemia, unspecified: Secondary | ICD-10-CM | POA: Diagnosis not present

## 2014-01-11 DIAGNOSIS — J45901 Unspecified asthma with (acute) exacerbation: Secondary | ICD-10-CM | POA: Diagnosis not present

## 2014-01-11 DIAGNOSIS — I10 Essential (primary) hypertension: Secondary | ICD-10-CM | POA: Diagnosis not present

## 2014-01-13 DIAGNOSIS — D649 Anemia, unspecified: Secondary | ICD-10-CM | POA: Diagnosis not present

## 2014-01-13 DIAGNOSIS — Z5189 Encounter for other specified aftercare: Secondary | ICD-10-CM | POA: Diagnosis not present

## 2014-01-13 DIAGNOSIS — J45901 Unspecified asthma with (acute) exacerbation: Secondary | ICD-10-CM | POA: Diagnosis not present

## 2014-01-13 DIAGNOSIS — I10 Essential (primary) hypertension: Secondary | ICD-10-CM | POA: Diagnosis not present

## 2014-01-14 DIAGNOSIS — I10 Essential (primary) hypertension: Secondary | ICD-10-CM | POA: Diagnosis not present

## 2014-01-14 DIAGNOSIS — J45901 Unspecified asthma with (acute) exacerbation: Secondary | ICD-10-CM | POA: Diagnosis not present

## 2014-01-14 DIAGNOSIS — D649 Anemia, unspecified: Secondary | ICD-10-CM | POA: Diagnosis not present

## 2014-01-14 DIAGNOSIS — Z5189 Encounter for other specified aftercare: Secondary | ICD-10-CM | POA: Diagnosis not present

## 2014-01-15 DIAGNOSIS — J449 Chronic obstructive pulmonary disease, unspecified: Secondary | ICD-10-CM | POA: Diagnosis not present

## 2014-01-15 DIAGNOSIS — Z79899 Other long term (current) drug therapy: Secondary | ICD-10-CM | POA: Diagnosis not present

## 2014-01-15 DIAGNOSIS — D509 Iron deficiency anemia, unspecified: Secondary | ICD-10-CM | POA: Diagnosis not present

## 2014-01-18 DIAGNOSIS — D649 Anemia, unspecified: Secondary | ICD-10-CM | POA: Diagnosis not present

## 2014-01-18 DIAGNOSIS — Z5189 Encounter for other specified aftercare: Secondary | ICD-10-CM | POA: Diagnosis not present

## 2014-01-18 DIAGNOSIS — I10 Essential (primary) hypertension: Secondary | ICD-10-CM | POA: Diagnosis not present

## 2014-01-18 DIAGNOSIS — J45901 Unspecified asthma with (acute) exacerbation: Secondary | ICD-10-CM | POA: Diagnosis not present

## 2014-01-19 DIAGNOSIS — J45901 Unspecified asthma with (acute) exacerbation: Secondary | ICD-10-CM | POA: Diagnosis not present

## 2014-01-19 DIAGNOSIS — Z5189 Encounter for other specified aftercare: Secondary | ICD-10-CM | POA: Diagnosis not present

## 2014-01-19 DIAGNOSIS — I10 Essential (primary) hypertension: Secondary | ICD-10-CM | POA: Diagnosis not present

## 2014-01-19 DIAGNOSIS — D649 Anemia, unspecified: Secondary | ICD-10-CM | POA: Diagnosis not present

## 2014-01-20 DIAGNOSIS — I10 Essential (primary) hypertension: Secondary | ICD-10-CM | POA: Diagnosis not present

## 2014-01-20 DIAGNOSIS — Z5189 Encounter for other specified aftercare: Secondary | ICD-10-CM | POA: Diagnosis not present

## 2014-01-20 DIAGNOSIS — J45901 Unspecified asthma with (acute) exacerbation: Secondary | ICD-10-CM | POA: Diagnosis not present

## 2014-01-20 DIAGNOSIS — D649 Anemia, unspecified: Secondary | ICD-10-CM | POA: Diagnosis not present

## 2014-01-21 DIAGNOSIS — D649 Anemia, unspecified: Secondary | ICD-10-CM | POA: Diagnosis not present

## 2014-01-21 DIAGNOSIS — J45901 Unspecified asthma with (acute) exacerbation: Secondary | ICD-10-CM | POA: Diagnosis not present

## 2014-01-21 DIAGNOSIS — Z5189 Encounter for other specified aftercare: Secondary | ICD-10-CM | POA: Diagnosis not present

## 2014-01-21 DIAGNOSIS — I10 Essential (primary) hypertension: Secondary | ICD-10-CM | POA: Diagnosis not present

## 2014-01-22 DIAGNOSIS — J45901 Unspecified asthma with (acute) exacerbation: Secondary | ICD-10-CM | POA: Diagnosis not present

## 2014-01-22 DIAGNOSIS — I10 Essential (primary) hypertension: Secondary | ICD-10-CM | POA: Diagnosis not present

## 2014-01-22 DIAGNOSIS — Z5189 Encounter for other specified aftercare: Secondary | ICD-10-CM | POA: Diagnosis not present

## 2014-01-22 DIAGNOSIS — D649 Anemia, unspecified: Secondary | ICD-10-CM | POA: Diagnosis not present

## 2014-01-26 DIAGNOSIS — D649 Anemia, unspecified: Secondary | ICD-10-CM | POA: Diagnosis not present

## 2014-01-26 DIAGNOSIS — I10 Essential (primary) hypertension: Secondary | ICD-10-CM | POA: Diagnosis not present

## 2014-01-26 DIAGNOSIS — Z5189 Encounter for other specified aftercare: Secondary | ICD-10-CM | POA: Diagnosis not present

## 2014-01-26 DIAGNOSIS — J45901 Unspecified asthma with (acute) exacerbation: Secondary | ICD-10-CM | POA: Diagnosis not present

## 2014-01-27 DIAGNOSIS — D649 Anemia, unspecified: Secondary | ICD-10-CM | POA: Diagnosis not present

## 2014-01-27 DIAGNOSIS — Z5189 Encounter for other specified aftercare: Secondary | ICD-10-CM | POA: Diagnosis not present

## 2014-01-27 DIAGNOSIS — J45901 Unspecified asthma with (acute) exacerbation: Secondary | ICD-10-CM | POA: Diagnosis not present

## 2014-01-27 DIAGNOSIS — I10 Essential (primary) hypertension: Secondary | ICD-10-CM | POA: Diagnosis not present

## 2014-01-28 DIAGNOSIS — D649 Anemia, unspecified: Secondary | ICD-10-CM | POA: Diagnosis not present

## 2014-01-28 DIAGNOSIS — Z5189 Encounter for other specified aftercare: Secondary | ICD-10-CM | POA: Diagnosis not present

## 2014-01-28 DIAGNOSIS — I10 Essential (primary) hypertension: Secondary | ICD-10-CM | POA: Diagnosis not present

## 2014-01-28 DIAGNOSIS — J45901 Unspecified asthma with (acute) exacerbation: Secondary | ICD-10-CM | POA: Diagnosis not present

## 2014-02-01 DIAGNOSIS — I10 Essential (primary) hypertension: Secondary | ICD-10-CM | POA: Diagnosis not present

## 2014-02-01 DIAGNOSIS — J45901 Unspecified asthma with (acute) exacerbation: Secondary | ICD-10-CM | POA: Diagnosis not present

## 2014-02-01 DIAGNOSIS — Z5189 Encounter for other specified aftercare: Secondary | ICD-10-CM | POA: Diagnosis not present

## 2014-02-01 DIAGNOSIS — D649 Anemia, unspecified: Secondary | ICD-10-CM | POA: Diagnosis not present

## 2014-02-02 DIAGNOSIS — J45901 Unspecified asthma with (acute) exacerbation: Secondary | ICD-10-CM | POA: Diagnosis not present

## 2014-02-02 DIAGNOSIS — I10 Essential (primary) hypertension: Secondary | ICD-10-CM | POA: Diagnosis not present

## 2014-02-02 DIAGNOSIS — D649 Anemia, unspecified: Secondary | ICD-10-CM | POA: Diagnosis not present

## 2014-02-02 DIAGNOSIS — Z5189 Encounter for other specified aftercare: Secondary | ICD-10-CM | POA: Diagnosis not present

## 2014-02-04 DIAGNOSIS — I10 Essential (primary) hypertension: Secondary | ICD-10-CM | POA: Diagnosis not present

## 2014-02-04 DIAGNOSIS — D649 Anemia, unspecified: Secondary | ICD-10-CM | POA: Diagnosis not present

## 2014-02-04 DIAGNOSIS — Z5189 Encounter for other specified aftercare: Secondary | ICD-10-CM | POA: Diagnosis not present

## 2014-02-04 DIAGNOSIS — J45901 Unspecified asthma with (acute) exacerbation: Secondary | ICD-10-CM | POA: Diagnosis not present

## 2014-02-08 DIAGNOSIS — I10 Essential (primary) hypertension: Secondary | ICD-10-CM | POA: Diagnosis not present

## 2014-02-08 DIAGNOSIS — J45901 Unspecified asthma with (acute) exacerbation: Secondary | ICD-10-CM | POA: Diagnosis not present

## 2014-02-08 DIAGNOSIS — D649 Anemia, unspecified: Secondary | ICD-10-CM | POA: Diagnosis not present

## 2014-02-08 DIAGNOSIS — Z5189 Encounter for other specified aftercare: Secondary | ICD-10-CM | POA: Diagnosis not present

## 2014-02-10 DIAGNOSIS — Z5189 Encounter for other specified aftercare: Secondary | ICD-10-CM | POA: Diagnosis not present

## 2014-02-10 DIAGNOSIS — J45901 Unspecified asthma with (acute) exacerbation: Secondary | ICD-10-CM | POA: Diagnosis not present

## 2014-02-10 DIAGNOSIS — D649 Anemia, unspecified: Secondary | ICD-10-CM | POA: Diagnosis not present

## 2014-02-10 DIAGNOSIS — I10 Essential (primary) hypertension: Secondary | ICD-10-CM | POA: Diagnosis not present

## 2014-02-12 DIAGNOSIS — D649 Anemia, unspecified: Secondary | ICD-10-CM | POA: Diagnosis not present

## 2014-02-12 DIAGNOSIS — I10 Essential (primary) hypertension: Secondary | ICD-10-CM | POA: Diagnosis not present

## 2014-02-12 DIAGNOSIS — J45901 Unspecified asthma with (acute) exacerbation: Secondary | ICD-10-CM | POA: Diagnosis not present

## 2014-02-12 DIAGNOSIS — Z5189 Encounter for other specified aftercare: Secondary | ICD-10-CM | POA: Diagnosis not present

## 2014-02-16 DIAGNOSIS — D649 Anemia, unspecified: Secondary | ICD-10-CM | POA: Diagnosis not present

## 2014-02-16 DIAGNOSIS — I10 Essential (primary) hypertension: Secondary | ICD-10-CM | POA: Diagnosis not present

## 2014-02-16 DIAGNOSIS — J45901 Unspecified asthma with (acute) exacerbation: Secondary | ICD-10-CM | POA: Diagnosis not present

## 2014-02-16 DIAGNOSIS — Z5189 Encounter for other specified aftercare: Secondary | ICD-10-CM | POA: Diagnosis not present

## 2014-02-22 DIAGNOSIS — I10 Essential (primary) hypertension: Secondary | ICD-10-CM | POA: Diagnosis not present

## 2014-02-22 DIAGNOSIS — D649 Anemia, unspecified: Secondary | ICD-10-CM | POA: Diagnosis not present

## 2014-02-22 DIAGNOSIS — Z5189 Encounter for other specified aftercare: Secondary | ICD-10-CM | POA: Diagnosis not present

## 2014-02-22 DIAGNOSIS — J45901 Unspecified asthma with (acute) exacerbation: Secondary | ICD-10-CM | POA: Diagnosis not present

## 2014-03-23 DIAGNOSIS — H2510 Age-related nuclear cataract, unspecified eye: Secondary | ICD-10-CM | POA: Diagnosis not present

## 2014-04-20 DIAGNOSIS — IMO0002 Reserved for concepts with insufficient information to code with codable children: Secondary | ICD-10-CM | POA: Diagnosis not present

## 2014-04-20 DIAGNOSIS — H269 Unspecified cataract: Secondary | ICD-10-CM | POA: Diagnosis not present

## 2014-04-20 DIAGNOSIS — Z1331 Encounter for screening for depression: Secondary | ICD-10-CM | POA: Diagnosis not present

## 2014-04-20 DIAGNOSIS — Z9181 History of falling: Secondary | ICD-10-CM | POA: Diagnosis not present

## 2014-05-03 DIAGNOSIS — J45909 Unspecified asthma, uncomplicated: Secondary | ICD-10-CM | POA: Diagnosis not present

## 2014-05-03 DIAGNOSIS — E785 Hyperlipidemia, unspecified: Secondary | ICD-10-CM | POA: Diagnosis not present

## 2014-05-03 DIAGNOSIS — I10 Essential (primary) hypertension: Secondary | ICD-10-CM | POA: Diagnosis not present

## 2014-05-03 DIAGNOSIS — H2510 Age-related nuclear cataract, unspecified eye: Secondary | ICD-10-CM | POA: Diagnosis not present

## 2014-05-17 DIAGNOSIS — H2589 Other age-related cataract: Secondary | ICD-10-CM | POA: Diagnosis not present

## 2014-05-20 DIAGNOSIS — E1129 Type 2 diabetes mellitus with other diabetic kidney complication: Secondary | ICD-10-CM | POA: Diagnosis not present

## 2014-05-20 DIAGNOSIS — D649 Anemia, unspecified: Secondary | ICD-10-CM | POA: Diagnosis not present

## 2014-05-20 DIAGNOSIS — E785 Hyperlipidemia, unspecified: Secondary | ICD-10-CM | POA: Diagnosis not present

## 2014-05-20 DIAGNOSIS — I10 Essential (primary) hypertension: Secondary | ICD-10-CM | POA: Diagnosis not present

## 2014-06-07 DIAGNOSIS — H269 Unspecified cataract: Secondary | ICD-10-CM | POA: Diagnosis not present

## 2014-06-07 DIAGNOSIS — E785 Hyperlipidemia, unspecified: Secondary | ICD-10-CM | POA: Diagnosis not present

## 2014-06-07 DIAGNOSIS — H2510 Age-related nuclear cataract, unspecified eye: Secondary | ICD-10-CM | POA: Diagnosis not present

## 2014-06-07 DIAGNOSIS — R5381 Other malaise: Secondary | ICD-10-CM | POA: Diagnosis not present

## 2014-06-07 DIAGNOSIS — K219 Gastro-esophageal reflux disease without esophagitis: Secondary | ICD-10-CM | POA: Diagnosis not present

## 2014-06-07 DIAGNOSIS — R5383 Other fatigue: Secondary | ICD-10-CM | POA: Diagnosis not present

## 2014-06-07 DIAGNOSIS — I10 Essential (primary) hypertension: Secondary | ICD-10-CM | POA: Diagnosis not present

## 2014-06-07 DIAGNOSIS — I4949 Other premature depolarization: Secondary | ICD-10-CM | POA: Diagnosis not present

## 2014-09-24 DIAGNOSIS — E1121 Type 2 diabetes mellitus with diabetic nephropathy: Secondary | ICD-10-CM | POA: Diagnosis not present

## 2014-09-24 DIAGNOSIS — Z23 Encounter for immunization: Secondary | ICD-10-CM | POA: Diagnosis not present

## 2014-09-24 DIAGNOSIS — M810 Age-related osteoporosis without current pathological fracture: Secondary | ICD-10-CM | POA: Diagnosis not present

## 2014-09-24 DIAGNOSIS — I10 Essential (primary) hypertension: Secondary | ICD-10-CM | POA: Diagnosis not present

## 2014-09-24 DIAGNOSIS — E785 Hyperlipidemia, unspecified: Secondary | ICD-10-CM | POA: Diagnosis not present

## 2014-09-24 DIAGNOSIS — D649 Anemia, unspecified: Secondary | ICD-10-CM | POA: Diagnosis not present

## 2014-10-15 IMAGING — CT CT ABD-PELV W/ CM
2 of 5 series · 16 of 46 positions shown, 18 images · IV contrast (CONTRAST)
Comparison: CT ABD-PELV W/ CM dated 05/15/2011; CT ABD W/CM dated
10/28/2004; CT PELVIS W/CM dated 10/28/2004

CLINICAL DATA: Left-sided abdominal pain.  Nausea.

EXAM:
CT ABDOMEN AND PELVIS WITH CONTRAST
TECHNIQUE: Multidetector CT imaging of the abdomen and pelvis was performed
using the standard protocol following bolus administration of
intravenous contrast.
CONTRAST:  80mL OMNIPAQUE IOHEXOL 300 MG/ML IV. Oral contrast was
also administered.

[Series 2: routine · axial · 0.68mm/px · z∈[+36,+370]mm · 13 of 75 slices shown, 15 images]
[im 4/75  soft-tissue]
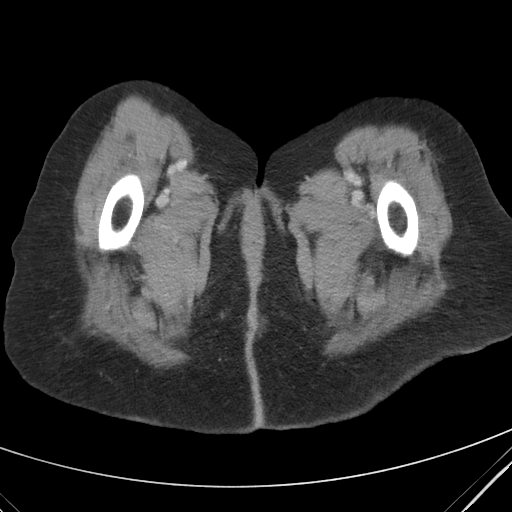
[im 4/75  bone]
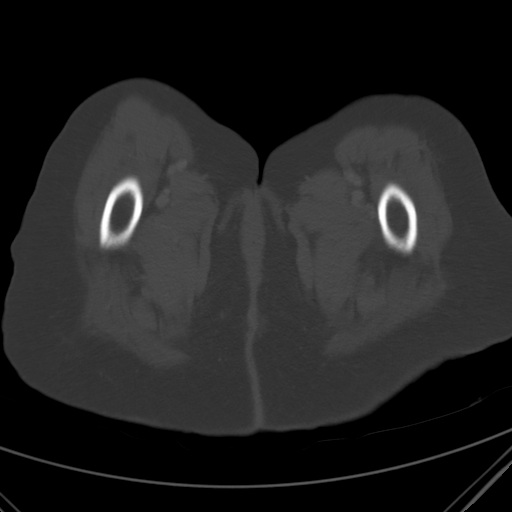
[im 12/75  soft-tissue]
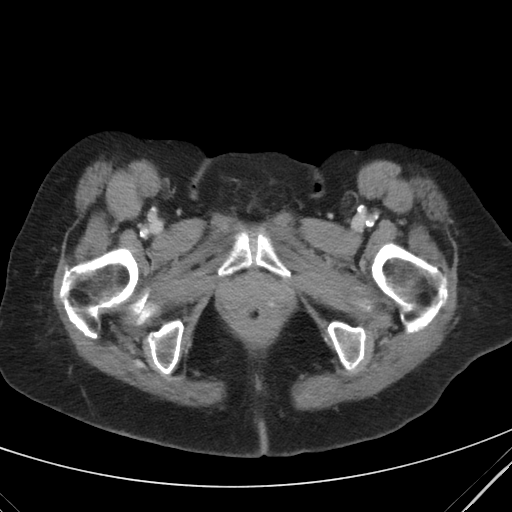
[im 16/75  soft-tissue]
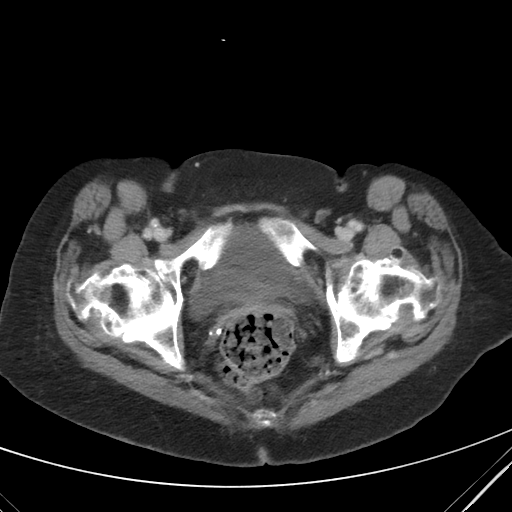
[im 20/75  soft-tissue]
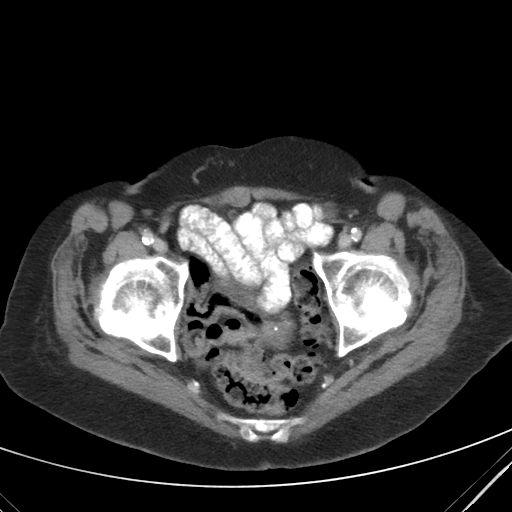
[im 28/75  soft-tissue]
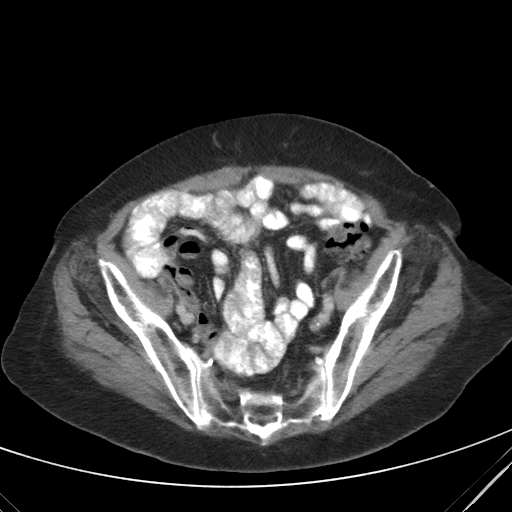
[im 32/75  soft-tissue]
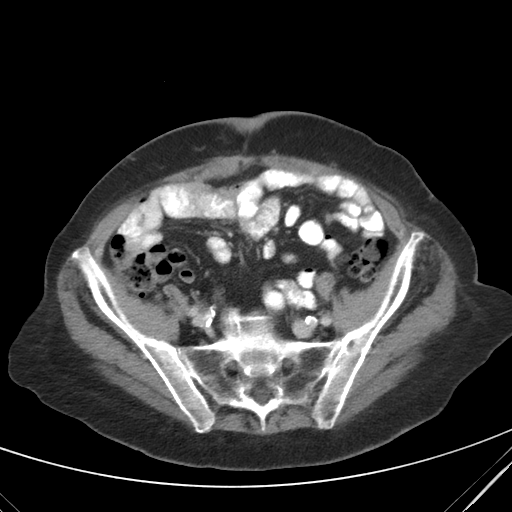
[im 39/75  soft-tissue]
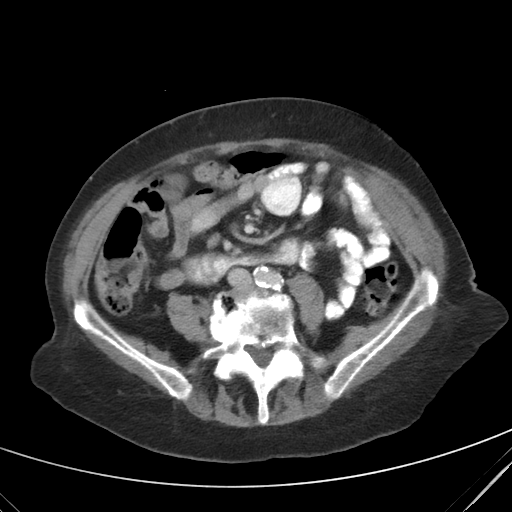
[im 43/75  soft-tissue]
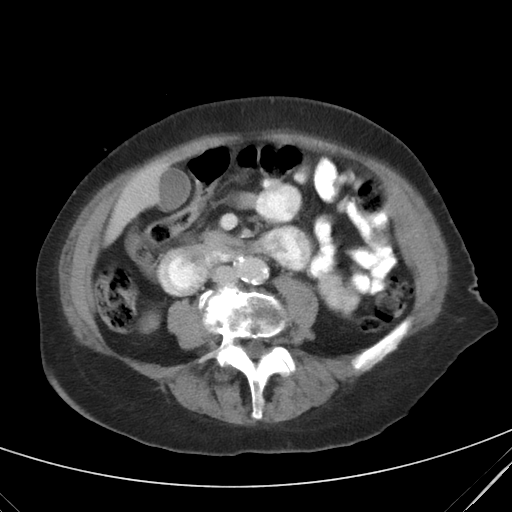
[im 47/75  soft-tissue]
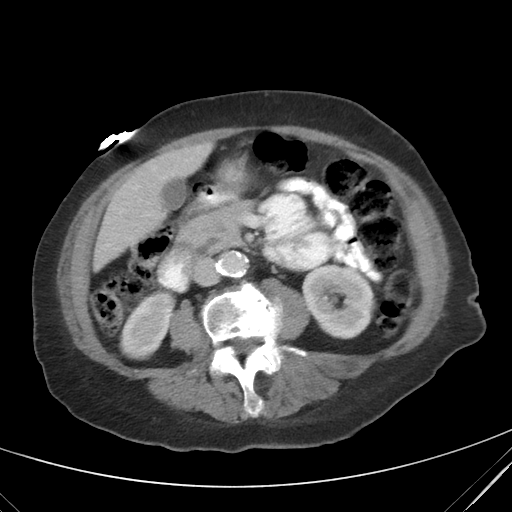
[im 47/75  bone]
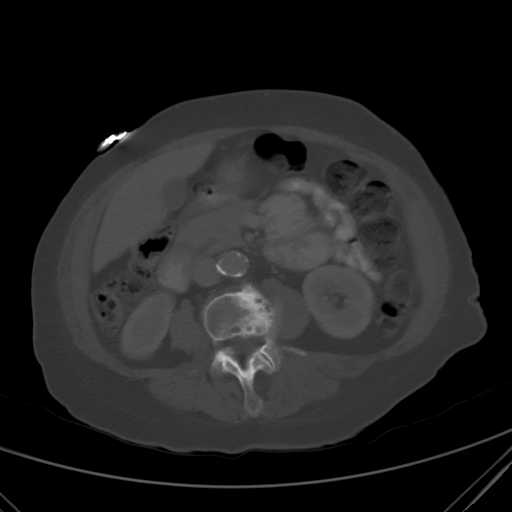
[im 55/75  soft-tissue]
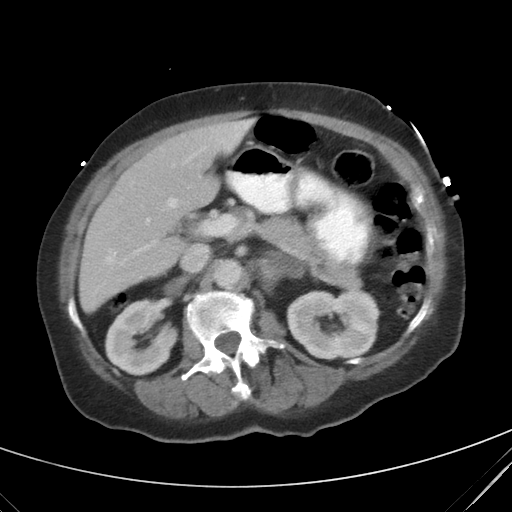
[im 59/75  soft-tissue]
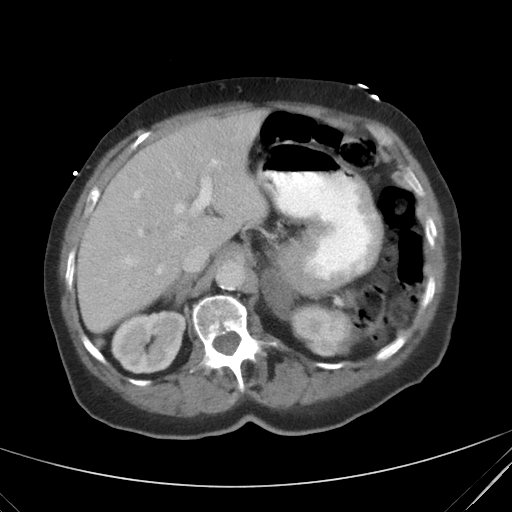
[im 63/75  soft-tissue]
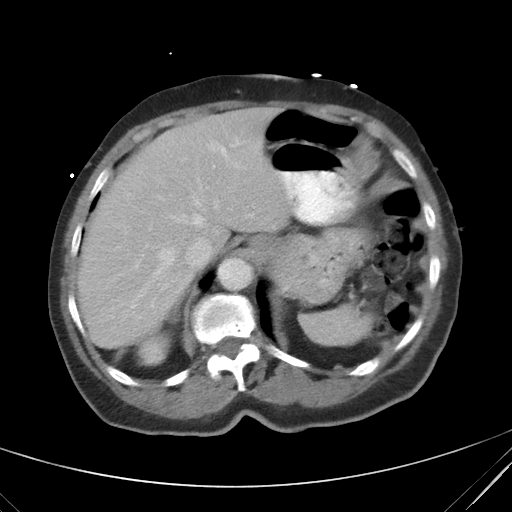
[im 71/75  soft-tissue]
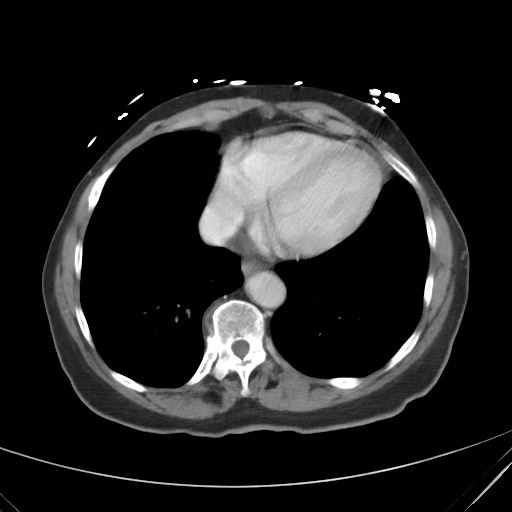

[mpr, coronals, coronal · coronal · 0.72mm/px · 3 of 97 slices shown]
[im 33/97  soft-tissue]
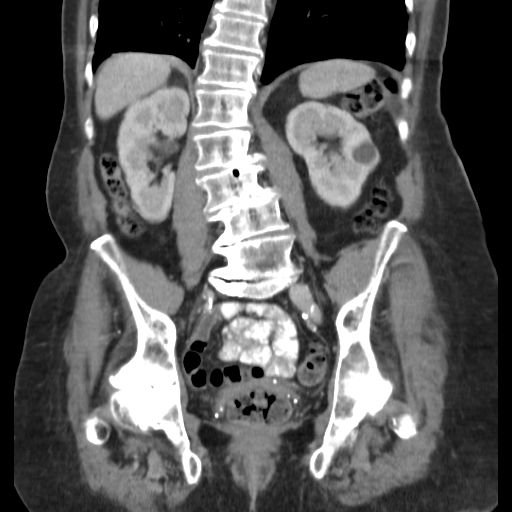
[im 43/97  soft-tissue]
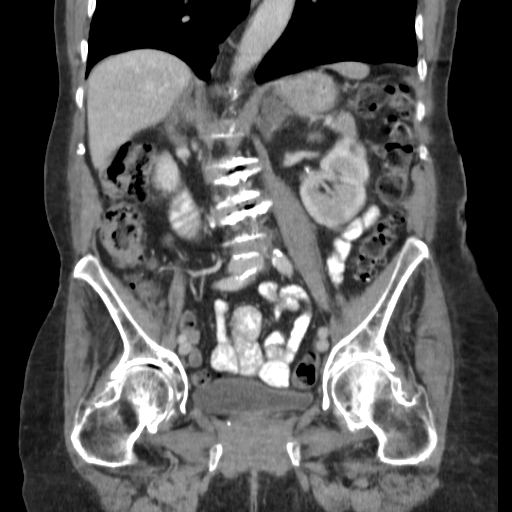
[im 54/97  soft-tissue]
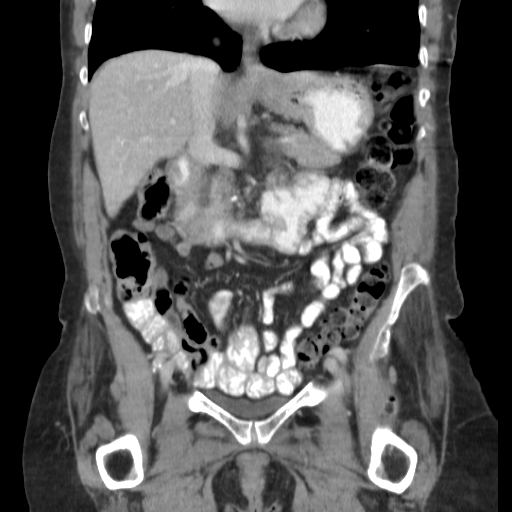

[16 of 46 positions shown; findings below may reference images not displayed]

FINDINGS: Cecal, descending and sigmoid colon diverticulosis without evidence
of acute diverticulitis. Large stool burden throughout the colon.
Thickening of the wall of the proximal stomach at the
esophagogastric junction, not present on the prior examinations.
Remainder of the stomach normal in appearance wall thickening
involving the descending duodenum. Remainder of the small bowel
normal in appearance. No ascites.

Small cysts in the posterior segment right lobe of liver, unchanged;
no significant abnormality involving the liver. Normal spleen,
pancreas, and gallbladder. No biliary ductal dilation. Bilateral
adrenal enlargement with nodularity involving the left adrenal
gland, unchanged, the left adrenal measuring approximately 3.6 x
cm. Cortical cysts involving both kidneys which are otherwise
unremarkable. Extensive aortoiliofemoral and visceral artery
atherosclerosis without aneurysm. No significant lymphadenopathy.

Uterus not visualized and either surgically absent or markedly
atrophic. No adnexal masses or free pelvic fluid. Numerous
phleboliths in the pelvis. Urinary bladder decompressed and
unremarkable.

Bone window images demonstrate severe degenerative changes involving
both hips, thoracolumbar scoliosis convex right, severe degenerative
disc disease and spondylosis at every level from L2-3 through L5-S1,
and lower thoracic spondylosis. Visualized lung bases clear.
Bochdalek's hernia involving the posterior right hemidiaphragm with
extrusion of intra-abdominal fat, unchanged. Heart size upper normal
slightly enlarged but stable.
IMPRESSION: 1. Thickening of the wall of the proximal stomach beginning at the
esophagogastric junction, a new finding since the prior
examinations. While this might reflect focal inflammation,
malignancy is not excluded based on the appearance. Upper endoscopy
may be helpful in further evaluation.
2. Thickening of the wall of the descending duodenum which could
also be evaluated at upper endoscopy.
3. Stable adenomatous adrenal hyperplasia.
4. Osseous findings and other incidental findings as above.

## 2014-11-25 DIAGNOSIS — N390 Urinary tract infection, site not specified: Secondary | ICD-10-CM | POA: Diagnosis not present

## 2014-11-25 DIAGNOSIS — B9620 Unspecified Escherichia coli [E. coli] as the cause of diseases classified elsewhere: Secondary | ICD-10-CM | POA: Diagnosis not present

## 2014-11-25 DIAGNOSIS — J9811 Atelectasis: Secondary | ICD-10-CM | POA: Diagnosis not present

## 2014-11-25 DIAGNOSIS — J441 Chronic obstructive pulmonary disease with (acute) exacerbation: Secondary | ICD-10-CM | POA: Diagnosis not present

## 2014-11-25 DIAGNOSIS — D649 Anemia, unspecified: Secondary | ICD-10-CM | POA: Diagnosis not present

## 2014-11-25 DIAGNOSIS — R531 Weakness: Secondary | ICD-10-CM | POA: Diagnosis not present

## 2014-11-25 DIAGNOSIS — R0602 Shortness of breath: Secondary | ICD-10-CM | POA: Diagnosis not present

## 2014-11-25 DIAGNOSIS — E119 Type 2 diabetes mellitus without complications: Secondary | ICD-10-CM | POA: Diagnosis not present

## 2014-11-25 DIAGNOSIS — Z87891 Personal history of nicotine dependence: Secondary | ICD-10-CM | POA: Diagnosis not present

## 2014-11-25 DIAGNOSIS — F0390 Unspecified dementia without behavioral disturbance: Secondary | ICD-10-CM | POA: Diagnosis not present

## 2014-11-25 DIAGNOSIS — R0600 Dyspnea, unspecified: Secondary | ICD-10-CM | POA: Diagnosis not present

## 2014-11-25 DIAGNOSIS — D509 Iron deficiency anemia, unspecified: Secondary | ICD-10-CM | POA: Diagnosis not present

## 2014-11-25 DIAGNOSIS — R918 Other nonspecific abnormal finding of lung field: Secondary | ICD-10-CM | POA: Diagnosis not present

## 2014-11-25 DIAGNOSIS — M810 Age-related osteoporosis without current pathological fracture: Secondary | ICD-10-CM | POA: Diagnosis not present

## 2014-11-25 DIAGNOSIS — E785 Hyperlipidemia, unspecified: Secondary | ICD-10-CM | POA: Diagnosis not present

## 2014-11-25 DIAGNOSIS — R069 Unspecified abnormalities of breathing: Secondary | ICD-10-CM | POA: Diagnosis not present

## 2014-11-25 DIAGNOSIS — K219 Gastro-esophageal reflux disease without esophagitis: Secondary | ICD-10-CM | POA: Diagnosis not present

## 2014-11-25 DIAGNOSIS — R0902 Hypoxemia: Secondary | ICD-10-CM | POA: Diagnosis not present

## 2014-11-25 DIAGNOSIS — I10 Essential (primary) hypertension: Secondary | ICD-10-CM | POA: Diagnosis not present

## 2014-11-26 DIAGNOSIS — I10 Essential (primary) hypertension: Secondary | ICD-10-CM | POA: Diagnosis not present

## 2014-11-26 DIAGNOSIS — N390 Urinary tract infection, site not specified: Secondary | ICD-10-CM | POA: Diagnosis not present

## 2014-11-26 DIAGNOSIS — J441 Chronic obstructive pulmonary disease with (acute) exacerbation: Secondary | ICD-10-CM | POA: Diagnosis not present

## 2014-11-26 DIAGNOSIS — D649 Anemia, unspecified: Secondary | ICD-10-CM | POA: Diagnosis not present

## 2014-11-27 DIAGNOSIS — D649 Anemia, unspecified: Secondary | ICD-10-CM | POA: Diagnosis not present

## 2014-11-27 DIAGNOSIS — I10 Essential (primary) hypertension: Secondary | ICD-10-CM | POA: Diagnosis not present

## 2014-11-27 DIAGNOSIS — N390 Urinary tract infection, site not specified: Secondary | ICD-10-CM | POA: Diagnosis not present

## 2014-11-27 DIAGNOSIS — J441 Chronic obstructive pulmonary disease with (acute) exacerbation: Secondary | ICD-10-CM | POA: Diagnosis not present

## 2014-11-30 DIAGNOSIS — N390 Urinary tract infection, site not specified: Secondary | ICD-10-CM | POA: Diagnosis not present

## 2014-11-30 DIAGNOSIS — J4520 Mild intermittent asthma, uncomplicated: Secondary | ICD-10-CM | POA: Diagnosis not present

## 2014-11-30 DIAGNOSIS — J449 Chronic obstructive pulmonary disease, unspecified: Secondary | ICD-10-CM | POA: Diagnosis not present

## 2014-11-30 DIAGNOSIS — E119 Type 2 diabetes mellitus without complications: Secondary | ICD-10-CM | POA: Diagnosis not present

## 2014-11-30 DIAGNOSIS — B9620 Unspecified Escherichia coli [E. coli] as the cause of diseases classified elsewhere: Secondary | ICD-10-CM | POA: Diagnosis not present

## 2014-11-30 DIAGNOSIS — M6281 Muscle weakness (generalized): Secondary | ICD-10-CM | POA: Diagnosis not present

## 2014-12-01 DIAGNOSIS — M6281 Muscle weakness (generalized): Secondary | ICD-10-CM | POA: Diagnosis not present

## 2014-12-01 DIAGNOSIS — J449 Chronic obstructive pulmonary disease, unspecified: Secondary | ICD-10-CM | POA: Diagnosis not present

## 2014-12-01 DIAGNOSIS — B9620 Unspecified Escherichia coli [E. coli] as the cause of diseases classified elsewhere: Secondary | ICD-10-CM | POA: Diagnosis not present

## 2014-12-01 DIAGNOSIS — J441 Chronic obstructive pulmonary disease with (acute) exacerbation: Secondary | ICD-10-CM | POA: Diagnosis not present

## 2014-12-01 DIAGNOSIS — J4520 Mild intermittent asthma, uncomplicated: Secondary | ICD-10-CM | POA: Diagnosis not present

## 2014-12-01 DIAGNOSIS — E119 Type 2 diabetes mellitus without complications: Secondary | ICD-10-CM | POA: Diagnosis not present

## 2014-12-01 DIAGNOSIS — I959 Hypotension, unspecified: Secondary | ICD-10-CM | POA: Diagnosis not present

## 2014-12-01 DIAGNOSIS — R32 Unspecified urinary incontinence: Secondary | ICD-10-CM | POA: Diagnosis not present

## 2014-12-01 DIAGNOSIS — Z23 Encounter for immunization: Secondary | ICD-10-CM | POA: Diagnosis not present

## 2014-12-01 DIAGNOSIS — N390 Urinary tract infection, site not specified: Secondary | ICD-10-CM | POA: Diagnosis not present

## 2014-12-06 DIAGNOSIS — J449 Chronic obstructive pulmonary disease, unspecified: Secondary | ICD-10-CM | POA: Diagnosis not present

## 2014-12-06 DIAGNOSIS — M6281 Muscle weakness (generalized): Secondary | ICD-10-CM | POA: Diagnosis not present

## 2014-12-06 DIAGNOSIS — J4520 Mild intermittent asthma, uncomplicated: Secondary | ICD-10-CM | POA: Diagnosis not present

## 2014-12-06 DIAGNOSIS — N390 Urinary tract infection, site not specified: Secondary | ICD-10-CM | POA: Diagnosis not present

## 2014-12-06 DIAGNOSIS — B9620 Unspecified Escherichia coli [E. coli] as the cause of diseases classified elsewhere: Secondary | ICD-10-CM | POA: Diagnosis not present

## 2014-12-06 DIAGNOSIS — E119 Type 2 diabetes mellitus without complications: Secondary | ICD-10-CM | POA: Diagnosis not present

## 2014-12-08 DIAGNOSIS — M6281 Muscle weakness (generalized): Secondary | ICD-10-CM | POA: Diagnosis not present

## 2014-12-08 DIAGNOSIS — E119 Type 2 diabetes mellitus without complications: Secondary | ICD-10-CM | POA: Diagnosis not present

## 2014-12-08 DIAGNOSIS — B9620 Unspecified Escherichia coli [E. coli] as the cause of diseases classified elsewhere: Secondary | ICD-10-CM | POA: Diagnosis not present

## 2014-12-08 DIAGNOSIS — J4520 Mild intermittent asthma, uncomplicated: Secondary | ICD-10-CM | POA: Diagnosis not present

## 2014-12-08 DIAGNOSIS — N390 Urinary tract infection, site not specified: Secondary | ICD-10-CM | POA: Diagnosis not present

## 2014-12-08 DIAGNOSIS — J449 Chronic obstructive pulmonary disease, unspecified: Secondary | ICD-10-CM | POA: Diagnosis not present

## 2014-12-10 DIAGNOSIS — J4520 Mild intermittent asthma, uncomplicated: Secondary | ICD-10-CM | POA: Diagnosis not present

## 2014-12-10 DIAGNOSIS — N390 Urinary tract infection, site not specified: Secondary | ICD-10-CM | POA: Diagnosis not present

## 2014-12-10 DIAGNOSIS — B9620 Unspecified Escherichia coli [E. coli] as the cause of diseases classified elsewhere: Secondary | ICD-10-CM | POA: Diagnosis not present

## 2014-12-10 DIAGNOSIS — M6281 Muscle weakness (generalized): Secondary | ICD-10-CM | POA: Diagnosis not present

## 2014-12-10 DIAGNOSIS — J449 Chronic obstructive pulmonary disease, unspecified: Secondary | ICD-10-CM | POA: Diagnosis not present

## 2014-12-10 DIAGNOSIS — E119 Type 2 diabetes mellitus without complications: Secondary | ICD-10-CM | POA: Diagnosis not present

## 2014-12-13 DIAGNOSIS — B9620 Unspecified Escherichia coli [E. coli] as the cause of diseases classified elsewhere: Secondary | ICD-10-CM | POA: Diagnosis not present

## 2014-12-13 DIAGNOSIS — E119 Type 2 diabetes mellitus without complications: Secondary | ICD-10-CM | POA: Diagnosis not present

## 2014-12-13 DIAGNOSIS — N390 Urinary tract infection, site not specified: Secondary | ICD-10-CM | POA: Diagnosis not present

## 2014-12-13 DIAGNOSIS — J449 Chronic obstructive pulmonary disease, unspecified: Secondary | ICD-10-CM | POA: Diagnosis not present

## 2014-12-13 DIAGNOSIS — J4520 Mild intermittent asthma, uncomplicated: Secondary | ICD-10-CM | POA: Diagnosis not present

## 2014-12-13 DIAGNOSIS — M6281 Muscle weakness (generalized): Secondary | ICD-10-CM | POA: Diagnosis not present

## 2014-12-15 DIAGNOSIS — M6281 Muscle weakness (generalized): Secondary | ICD-10-CM | POA: Diagnosis not present

## 2014-12-15 DIAGNOSIS — B9620 Unspecified Escherichia coli [E. coli] as the cause of diseases classified elsewhere: Secondary | ICD-10-CM | POA: Diagnosis not present

## 2014-12-15 DIAGNOSIS — J4520 Mild intermittent asthma, uncomplicated: Secondary | ICD-10-CM | POA: Diagnosis not present

## 2014-12-15 DIAGNOSIS — N390 Urinary tract infection, site not specified: Secondary | ICD-10-CM | POA: Diagnosis not present

## 2014-12-15 DIAGNOSIS — E119 Type 2 diabetes mellitus without complications: Secondary | ICD-10-CM | POA: Diagnosis not present

## 2014-12-15 DIAGNOSIS — J449 Chronic obstructive pulmonary disease, unspecified: Secondary | ICD-10-CM | POA: Diagnosis not present

## 2014-12-17 DIAGNOSIS — M6281 Muscle weakness (generalized): Secondary | ICD-10-CM | POA: Diagnosis not present

## 2014-12-17 DIAGNOSIS — B9620 Unspecified Escherichia coli [E. coli] as the cause of diseases classified elsewhere: Secondary | ICD-10-CM | POA: Diagnosis not present

## 2014-12-17 DIAGNOSIS — E119 Type 2 diabetes mellitus without complications: Secondary | ICD-10-CM | POA: Diagnosis not present

## 2014-12-17 DIAGNOSIS — N390 Urinary tract infection, site not specified: Secondary | ICD-10-CM | POA: Diagnosis not present

## 2014-12-17 DIAGNOSIS — J449 Chronic obstructive pulmonary disease, unspecified: Secondary | ICD-10-CM | POA: Diagnosis not present

## 2014-12-17 DIAGNOSIS — J4520 Mild intermittent asthma, uncomplicated: Secondary | ICD-10-CM | POA: Diagnosis not present

## 2014-12-21 DIAGNOSIS — J449 Chronic obstructive pulmonary disease, unspecified: Secondary | ICD-10-CM | POA: Diagnosis not present

## 2014-12-21 DIAGNOSIS — E119 Type 2 diabetes mellitus without complications: Secondary | ICD-10-CM | POA: Diagnosis not present

## 2014-12-21 DIAGNOSIS — J4520 Mild intermittent asthma, uncomplicated: Secondary | ICD-10-CM | POA: Diagnosis not present

## 2014-12-21 DIAGNOSIS — B9620 Unspecified Escherichia coli [E. coli] as the cause of diseases classified elsewhere: Secondary | ICD-10-CM | POA: Diagnosis not present

## 2014-12-21 DIAGNOSIS — N390 Urinary tract infection, site not specified: Secondary | ICD-10-CM | POA: Diagnosis not present

## 2014-12-21 DIAGNOSIS — M6281 Muscle weakness (generalized): Secondary | ICD-10-CM | POA: Diagnosis not present

## 2014-12-22 DIAGNOSIS — N390 Urinary tract infection, site not specified: Secondary | ICD-10-CM | POA: Diagnosis not present

## 2014-12-22 DIAGNOSIS — E119 Type 2 diabetes mellitus without complications: Secondary | ICD-10-CM | POA: Diagnosis not present

## 2014-12-22 DIAGNOSIS — J449 Chronic obstructive pulmonary disease, unspecified: Secondary | ICD-10-CM | POA: Diagnosis not present

## 2014-12-22 DIAGNOSIS — B9620 Unspecified Escherichia coli [E. coli] as the cause of diseases classified elsewhere: Secondary | ICD-10-CM | POA: Diagnosis not present

## 2014-12-22 DIAGNOSIS — M6281 Muscle weakness (generalized): Secondary | ICD-10-CM | POA: Diagnosis not present

## 2014-12-22 DIAGNOSIS — J4520 Mild intermittent asthma, uncomplicated: Secondary | ICD-10-CM | POA: Diagnosis not present

## 2014-12-23 DIAGNOSIS — J449 Chronic obstructive pulmonary disease, unspecified: Secondary | ICD-10-CM | POA: Diagnosis not present

## 2014-12-23 DIAGNOSIS — M6281 Muscle weakness (generalized): Secondary | ICD-10-CM | POA: Diagnosis not present

## 2014-12-23 DIAGNOSIS — B9620 Unspecified Escherichia coli [E. coli] as the cause of diseases classified elsewhere: Secondary | ICD-10-CM | POA: Diagnosis not present

## 2014-12-23 DIAGNOSIS — J4520 Mild intermittent asthma, uncomplicated: Secondary | ICD-10-CM | POA: Diagnosis not present

## 2014-12-23 DIAGNOSIS — E119 Type 2 diabetes mellitus without complications: Secondary | ICD-10-CM | POA: Diagnosis not present

## 2014-12-23 DIAGNOSIS — N390 Urinary tract infection, site not specified: Secondary | ICD-10-CM | POA: Diagnosis not present

## 2014-12-28 DIAGNOSIS — M6281 Muscle weakness (generalized): Secondary | ICD-10-CM | POA: Diagnosis not present

## 2014-12-28 DIAGNOSIS — B9620 Unspecified Escherichia coli [E. coli] as the cause of diseases classified elsewhere: Secondary | ICD-10-CM | POA: Diagnosis not present

## 2014-12-28 DIAGNOSIS — N390 Urinary tract infection, site not specified: Secondary | ICD-10-CM | POA: Diagnosis not present

## 2014-12-28 DIAGNOSIS — J449 Chronic obstructive pulmonary disease, unspecified: Secondary | ICD-10-CM | POA: Diagnosis not present

## 2014-12-28 DIAGNOSIS — E119 Type 2 diabetes mellitus without complications: Secondary | ICD-10-CM | POA: Diagnosis not present

## 2014-12-28 DIAGNOSIS — J4520 Mild intermittent asthma, uncomplicated: Secondary | ICD-10-CM | POA: Diagnosis not present

## 2014-12-30 DIAGNOSIS — J4520 Mild intermittent asthma, uncomplicated: Secondary | ICD-10-CM | POA: Diagnosis not present

## 2014-12-30 DIAGNOSIS — J449 Chronic obstructive pulmonary disease, unspecified: Secondary | ICD-10-CM | POA: Diagnosis not present

## 2014-12-30 DIAGNOSIS — N390 Urinary tract infection, site not specified: Secondary | ICD-10-CM | POA: Diagnosis not present

## 2014-12-30 DIAGNOSIS — B9620 Unspecified Escherichia coli [E. coli] as the cause of diseases classified elsewhere: Secondary | ICD-10-CM | POA: Diagnosis not present

## 2014-12-30 DIAGNOSIS — M6281 Muscle weakness (generalized): Secondary | ICD-10-CM | POA: Diagnosis not present

## 2014-12-30 DIAGNOSIS — E119 Type 2 diabetes mellitus without complications: Secondary | ICD-10-CM | POA: Diagnosis not present

## 2015-01-14 DIAGNOSIS — D649 Anemia, unspecified: Secondary | ICD-10-CM | POA: Diagnosis not present

## 2015-01-14 DIAGNOSIS — E785 Hyperlipidemia, unspecified: Secondary | ICD-10-CM | POA: Diagnosis not present

## 2015-01-14 DIAGNOSIS — E119 Type 2 diabetes mellitus without complications: Secondary | ICD-10-CM | POA: Diagnosis not present

## 2015-01-14 DIAGNOSIS — I10 Essential (primary) hypertension: Secondary | ICD-10-CM | POA: Diagnosis not present

## 2015-01-14 DIAGNOSIS — F418 Other specified anxiety disorders: Secondary | ICD-10-CM | POA: Diagnosis not present

## 2015-05-20 DIAGNOSIS — D649 Anemia, unspecified: Secondary | ICD-10-CM | POA: Diagnosis not present

## 2015-05-20 DIAGNOSIS — E785 Hyperlipidemia, unspecified: Secondary | ICD-10-CM | POA: Diagnosis not present

## 2015-05-20 DIAGNOSIS — I10 Essential (primary) hypertension: Secondary | ICD-10-CM | POA: Diagnosis not present

## 2015-05-20 DIAGNOSIS — Z6822 Body mass index (BMI) 22.0-22.9, adult: Secondary | ICD-10-CM | POA: Diagnosis not present

## 2015-05-20 DIAGNOSIS — E1121 Type 2 diabetes mellitus with diabetic nephropathy: Secondary | ICD-10-CM | POA: Diagnosis not present

## 2015-10-14 DIAGNOSIS — Z23 Encounter for immunization: Secondary | ICD-10-CM | POA: Diagnosis not present

## 2015-10-14 DIAGNOSIS — E785 Hyperlipidemia, unspecified: Secondary | ICD-10-CM | POA: Diagnosis not present

## 2015-10-14 DIAGNOSIS — J069 Acute upper respiratory infection, unspecified: Secondary | ICD-10-CM | POA: Diagnosis not present

## 2015-10-14 DIAGNOSIS — I10 Essential (primary) hypertension: Secondary | ICD-10-CM | POA: Diagnosis not present

## 2015-10-14 DIAGNOSIS — Z9181 History of falling: Secondary | ICD-10-CM | POA: Diagnosis not present

## 2015-10-14 DIAGNOSIS — E1121 Type 2 diabetes mellitus with diabetic nephropathy: Secondary | ICD-10-CM | POA: Diagnosis not present

## 2015-10-14 DIAGNOSIS — Z1389 Encounter for screening for other disorder: Secondary | ICD-10-CM | POA: Diagnosis not present

## 2015-10-14 DIAGNOSIS — D649 Anemia, unspecified: Secondary | ICD-10-CM | POA: Diagnosis not present

## 2016-03-23 DIAGNOSIS — Z6823 Body mass index (BMI) 23.0-23.9, adult: Secondary | ICD-10-CM | POA: Diagnosis not present

## 2016-03-23 DIAGNOSIS — I10 Essential (primary) hypertension: Secondary | ICD-10-CM | POA: Diagnosis not present

## 2016-03-23 DIAGNOSIS — E1121 Type 2 diabetes mellitus with diabetic nephropathy: Secondary | ICD-10-CM | POA: Diagnosis not present

## 2016-03-23 DIAGNOSIS — E785 Hyperlipidemia, unspecified: Secondary | ICD-10-CM | POA: Diagnosis not present

## 2016-03-23 DIAGNOSIS — J449 Chronic obstructive pulmonary disease, unspecified: Secondary | ICD-10-CM | POA: Diagnosis not present

## 2016-03-23 DIAGNOSIS — D649 Anemia, unspecified: Secondary | ICD-10-CM | POA: Diagnosis not present

## 2016-11-07 DIAGNOSIS — Z23 Encounter for immunization: Secondary | ICD-10-CM | POA: Diagnosis not present

## 2016-11-07 DIAGNOSIS — E1121 Type 2 diabetes mellitus with diabetic nephropathy: Secondary | ICD-10-CM | POA: Diagnosis not present

## 2016-11-07 DIAGNOSIS — E785 Hyperlipidemia, unspecified: Secondary | ICD-10-CM | POA: Diagnosis not present

## 2016-11-07 DIAGNOSIS — I10 Essential (primary) hypertension: Secondary | ICD-10-CM | POA: Diagnosis not present

## 2016-11-07 DIAGNOSIS — Z9181 History of falling: Secondary | ICD-10-CM | POA: Diagnosis not present

## 2016-11-07 DIAGNOSIS — J449 Chronic obstructive pulmonary disease, unspecified: Secondary | ICD-10-CM | POA: Diagnosis not present

## 2016-11-14 DIAGNOSIS — E785 Hyperlipidemia, unspecified: Secondary | ICD-10-CM | POA: Diagnosis not present

## 2016-11-14 DIAGNOSIS — I10 Essential (primary) hypertension: Secondary | ICD-10-CM | POA: Diagnosis not present

## 2017-05-22 DIAGNOSIS — E785 Hyperlipidemia, unspecified: Secondary | ICD-10-CM | POA: Diagnosis not present

## 2017-05-22 DIAGNOSIS — Z9181 History of falling: Secondary | ICD-10-CM | POA: Diagnosis not present

## 2017-05-22 DIAGNOSIS — J449 Chronic obstructive pulmonary disease, unspecified: Secondary | ICD-10-CM | POA: Diagnosis not present

## 2017-05-22 DIAGNOSIS — K219 Gastro-esophageal reflux disease without esophagitis: Secondary | ICD-10-CM | POA: Diagnosis not present

## 2017-05-22 DIAGNOSIS — E1121 Type 2 diabetes mellitus with diabetic nephropathy: Secondary | ICD-10-CM | POA: Diagnosis not present

## 2017-05-22 DIAGNOSIS — I10 Essential (primary) hypertension: Secondary | ICD-10-CM | POA: Diagnosis not present

## 2017-05-22 DIAGNOSIS — Z1389 Encounter for screening for other disorder: Secondary | ICD-10-CM | POA: Diagnosis not present

## 2017-05-22 DIAGNOSIS — D649 Anemia, unspecified: Secondary | ICD-10-CM | POA: Diagnosis not present

## 2017-05-22 DIAGNOSIS — M810 Age-related osteoporosis without current pathological fracture: Secondary | ICD-10-CM | POA: Diagnosis not present

## 2017-11-22 DIAGNOSIS — Z136 Encounter for screening for cardiovascular disorders: Secondary | ICD-10-CM | POA: Diagnosis not present

## 2017-11-22 DIAGNOSIS — Z0000 Encounter for general adult medical examination without abnormal findings: Secondary | ICD-10-CM | POA: Diagnosis not present

## 2017-11-22 DIAGNOSIS — E1121 Type 2 diabetes mellitus with diabetic nephropathy: Secondary | ICD-10-CM | POA: Diagnosis not present

## 2017-11-22 DIAGNOSIS — I10 Essential (primary) hypertension: Secondary | ICD-10-CM | POA: Diagnosis not present

## 2017-11-22 DIAGNOSIS — J449 Chronic obstructive pulmonary disease, unspecified: Secondary | ICD-10-CM | POA: Diagnosis not present

## 2017-11-22 DIAGNOSIS — Z23 Encounter for immunization: Secondary | ICD-10-CM | POA: Diagnosis not present

## 2017-11-22 DIAGNOSIS — R413 Other amnesia: Secondary | ICD-10-CM | POA: Diagnosis not present

## 2017-11-22 DIAGNOSIS — E785 Hyperlipidemia, unspecified: Secondary | ICD-10-CM | POA: Diagnosis not present

## 2017-11-22 DIAGNOSIS — R32 Unspecified urinary incontinence: Secondary | ICD-10-CM | POA: Diagnosis not present

## 2017-11-22 DIAGNOSIS — Z139 Encounter for screening, unspecified: Secondary | ICD-10-CM | POA: Diagnosis not present

## 2017-11-22 DIAGNOSIS — N390 Urinary tract infection, site not specified: Secondary | ICD-10-CM | POA: Diagnosis not present

## 2018-05-12 DIAGNOSIS — Z1339 Encounter for screening examination for other mental health and behavioral disorders: Secondary | ICD-10-CM | POA: Diagnosis not present

## 2018-05-12 DIAGNOSIS — E1121 Type 2 diabetes mellitus with diabetic nephropathy: Secondary | ICD-10-CM | POA: Diagnosis not present

## 2018-05-12 DIAGNOSIS — M810 Age-related osteoporosis without current pathological fracture: Secondary | ICD-10-CM | POA: Diagnosis not present

## 2018-05-12 DIAGNOSIS — I10 Essential (primary) hypertension: Secondary | ICD-10-CM | POA: Diagnosis not present

## 2018-05-12 DIAGNOSIS — J449 Chronic obstructive pulmonary disease, unspecified: Secondary | ICD-10-CM | POA: Diagnosis not present

## 2018-05-29 DIAGNOSIS — M79605 Pain in left leg: Secondary | ICD-10-CM | POA: Diagnosis not present

## 2018-05-29 DIAGNOSIS — R531 Weakness: Secondary | ICD-10-CM | POA: Diagnosis not present

## 2018-05-29 DIAGNOSIS — Z87891 Personal history of nicotine dependence: Secondary | ICD-10-CM | POA: Diagnosis not present

## 2018-05-29 DIAGNOSIS — B9620 Unspecified Escherichia coli [E. coli] as the cause of diseases classified elsewhere: Secondary | ICD-10-CM | POA: Diagnosis not present

## 2018-05-29 DIAGNOSIS — R0600 Dyspnea, unspecified: Secondary | ICD-10-CM | POA: Diagnosis not present

## 2018-05-29 DIAGNOSIS — F0390 Unspecified dementia without behavioral disturbance: Secondary | ICD-10-CM | POA: Diagnosis not present

## 2018-05-29 DIAGNOSIS — M79604 Pain in right leg: Secondary | ICD-10-CM | POA: Diagnosis not present

## 2018-05-29 DIAGNOSIS — M810 Age-related osteoporosis without current pathological fracture: Secondary | ICD-10-CM | POA: Diagnosis not present

## 2018-05-29 DIAGNOSIS — I248 Other forms of acute ischemic heart disease: Secondary | ICD-10-CM | POA: Diagnosis not present

## 2018-05-29 DIAGNOSIS — Z7902 Long term (current) use of antithrombotics/antiplatelets: Secondary | ICD-10-CM | POA: Diagnosis not present

## 2018-05-29 DIAGNOSIS — J45909 Unspecified asthma, uncomplicated: Secondary | ICD-10-CM | POA: Diagnosis not present

## 2018-05-29 DIAGNOSIS — R509 Fever, unspecified: Secondary | ICD-10-CM | POA: Diagnosis not present

## 2018-05-29 DIAGNOSIS — E46 Unspecified protein-calorie malnutrition: Secondary | ICD-10-CM | POA: Diagnosis not present

## 2018-05-29 DIAGNOSIS — E785 Hyperlipidemia, unspecified: Secondary | ICD-10-CM | POA: Diagnosis not present

## 2018-05-29 DIAGNOSIS — N12 Tubulo-interstitial nephritis, not specified as acute or chronic: Secondary | ICD-10-CM | POA: Diagnosis not present

## 2018-05-29 DIAGNOSIS — E119 Type 2 diabetes mellitus without complications: Secondary | ICD-10-CM | POA: Diagnosis not present

## 2018-05-29 DIAGNOSIS — Z6821 Body mass index (BMI) 21.0-21.9, adult: Secondary | ICD-10-CM | POA: Diagnosis not present

## 2018-05-29 DIAGNOSIS — G9341 Metabolic encephalopathy: Secondary | ICD-10-CM | POA: Diagnosis not present

## 2018-05-29 DIAGNOSIS — E611 Iron deficiency: Secondary | ICD-10-CM | POA: Diagnosis not present

## 2018-05-29 DIAGNOSIS — R0902 Hypoxemia: Secondary | ICD-10-CM | POA: Diagnosis not present

## 2018-05-29 DIAGNOSIS — I10 Essential (primary) hypertension: Secondary | ICD-10-CM | POA: Diagnosis not present

## 2018-05-29 DIAGNOSIS — R5381 Other malaise: Secondary | ICD-10-CM | POA: Diagnosis not present

## 2018-05-29 DIAGNOSIS — K219 Gastro-esophageal reflux disease without esophagitis: Secondary | ICD-10-CM | POA: Diagnosis not present

## 2018-05-29 DIAGNOSIS — K7689 Other specified diseases of liver: Secondary | ICD-10-CM | POA: Diagnosis not present

## 2018-05-30 DIAGNOSIS — R748 Abnormal levels of other serum enzymes: Secondary | ICD-10-CM | POA: Diagnosis not present

## 2018-05-30 DIAGNOSIS — I10 Essential (primary) hypertension: Secondary | ICD-10-CM | POA: Diagnosis present

## 2018-05-30 DIAGNOSIS — R4182 Altered mental status, unspecified: Secondary | ICD-10-CM | POA: Diagnosis not present

## 2018-05-30 DIAGNOSIS — R279 Unspecified lack of coordination: Secondary | ICD-10-CM | POA: Diagnosis not present

## 2018-05-30 DIAGNOSIS — F0390 Unspecified dementia without behavioral disturbance: Secondary | ICD-10-CM | POA: Diagnosis present

## 2018-05-30 DIAGNOSIS — E785 Hyperlipidemia, unspecified: Secondary | ICD-10-CM | POA: Diagnosis present

## 2018-05-30 DIAGNOSIS — Z6821 Body mass index (BMI) 21.0-21.9, adult: Secondary | ICD-10-CM | POA: Diagnosis not present

## 2018-05-30 DIAGNOSIS — M810 Age-related osteoporosis without current pathological fracture: Secondary | ICD-10-CM | POA: Diagnosis present

## 2018-05-30 DIAGNOSIS — R1312 Dysphagia, oropharyngeal phase: Secondary | ICD-10-CM | POA: Diagnosis not present

## 2018-05-30 DIAGNOSIS — R278 Other lack of coordination: Secondary | ICD-10-CM | POA: Diagnosis not present

## 2018-05-30 DIAGNOSIS — K5900 Constipation, unspecified: Secondary | ICD-10-CM | POA: Diagnosis not present

## 2018-05-30 DIAGNOSIS — J45909 Unspecified asthma, uncomplicated: Secondary | ICD-10-CM | POA: Diagnosis present

## 2018-05-30 DIAGNOSIS — G9341 Metabolic encephalopathy: Secondary | ICD-10-CM | POA: Diagnosis present

## 2018-05-30 DIAGNOSIS — Z87891 Personal history of nicotine dependence: Secondary | ICD-10-CM | POA: Diagnosis not present

## 2018-05-30 DIAGNOSIS — R41840 Attention and concentration deficit: Secondary | ICD-10-CM | POA: Diagnosis not present

## 2018-05-30 DIAGNOSIS — N12 Tubulo-interstitial nephritis, not specified as acute or chronic: Secondary | ICD-10-CM | POA: Diagnosis present

## 2018-05-30 DIAGNOSIS — M79669 Pain in unspecified lower leg: Secondary | ICD-10-CM | POA: Diagnosis not present

## 2018-05-30 DIAGNOSIS — R531 Weakness: Secondary | ICD-10-CM | POA: Diagnosis not present

## 2018-05-30 DIAGNOSIS — D649 Anemia, unspecified: Secondary | ICD-10-CM | POA: Diagnosis not present

## 2018-05-30 DIAGNOSIS — M79605 Pain in left leg: Secondary | ICD-10-CM | POA: Diagnosis present

## 2018-05-30 DIAGNOSIS — R262 Difficulty in walking, not elsewhere classified: Secondary | ICD-10-CM | POA: Diagnosis not present

## 2018-05-30 DIAGNOSIS — N390 Urinary tract infection, site not specified: Secondary | ICD-10-CM | POA: Diagnosis not present

## 2018-05-30 DIAGNOSIS — Z9181 History of falling: Secondary | ICD-10-CM | POA: Diagnosis not present

## 2018-05-30 DIAGNOSIS — B9620 Unspecified Escherichia coli [E. coli] as the cause of diseases classified elsewhere: Secondary | ICD-10-CM | POA: Diagnosis present

## 2018-05-30 DIAGNOSIS — E46 Unspecified protein-calorie malnutrition: Secondary | ICD-10-CM | POA: Diagnosis present

## 2018-05-30 DIAGNOSIS — K219 Gastro-esophageal reflux disease without esophagitis: Secondary | ICD-10-CM | POA: Diagnosis present

## 2018-05-30 DIAGNOSIS — E119 Type 2 diabetes mellitus without complications: Secondary | ICD-10-CM | POA: Diagnosis not present

## 2018-05-30 DIAGNOSIS — Z7902 Long term (current) use of antithrombotics/antiplatelets: Secondary | ICD-10-CM | POA: Diagnosis not present

## 2018-05-30 DIAGNOSIS — E611 Iron deficiency: Secondary | ICD-10-CM | POA: Diagnosis present

## 2018-05-30 DIAGNOSIS — M79604 Pain in right leg: Secondary | ICD-10-CM | POA: Diagnosis present

## 2018-05-30 DIAGNOSIS — I248 Other forms of acute ischemic heart disease: Secondary | ICD-10-CM | POA: Diagnosis present

## 2018-05-30 DIAGNOSIS — M6281 Muscle weakness (generalized): Secondary | ICD-10-CM | POA: Diagnosis not present

## 2018-05-30 DIAGNOSIS — R7989 Other specified abnormal findings of blood chemistry: Secondary | ICD-10-CM | POA: Diagnosis not present

## 2018-06-02 DIAGNOSIS — N12 Tubulo-interstitial nephritis, not specified as acute or chronic: Secondary | ICD-10-CM | POA: Diagnosis not present

## 2018-06-02 DIAGNOSIS — F0390 Unspecified dementia without behavioral disturbance: Secondary | ICD-10-CM | POA: Diagnosis not present

## 2018-06-02 DIAGNOSIS — M79669 Pain in unspecified lower leg: Secondary | ICD-10-CM | POA: Diagnosis not present

## 2018-06-02 DIAGNOSIS — K219 Gastro-esophageal reflux disease without esophagitis: Secondary | ICD-10-CM | POA: Diagnosis not present

## 2018-06-02 DIAGNOSIS — E46 Unspecified protein-calorie malnutrition: Secondary | ICD-10-CM | POA: Diagnosis not present

## 2018-06-02 DIAGNOSIS — K5900 Constipation, unspecified: Secondary | ICD-10-CM | POA: Diagnosis not present

## 2018-06-02 DIAGNOSIS — N390 Urinary tract infection, site not specified: Secondary | ICD-10-CM | POA: Diagnosis not present

## 2018-06-02 DIAGNOSIS — R262 Difficulty in walking, not elsewhere classified: Secondary | ICD-10-CM | POA: Diagnosis not present

## 2018-06-02 DIAGNOSIS — Z9181 History of falling: Secondary | ICD-10-CM | POA: Diagnosis not present

## 2018-06-02 DIAGNOSIS — R531 Weakness: Secondary | ICD-10-CM | POA: Diagnosis not present

## 2018-06-02 DIAGNOSIS — M6281 Muscle weakness (generalized): Secondary | ICD-10-CM | POA: Diagnosis not present

## 2018-06-02 DIAGNOSIS — I214 Non-ST elevation (NSTEMI) myocardial infarction: Secondary | ICD-10-CM | POA: Diagnosis not present

## 2018-06-02 DIAGNOSIS — D649 Anemia, unspecified: Secondary | ICD-10-CM | POA: Diagnosis not present

## 2018-06-02 DIAGNOSIS — M810 Age-related osteoporosis without current pathological fracture: Secondary | ICD-10-CM | POA: Diagnosis not present

## 2018-06-02 DIAGNOSIS — R279 Unspecified lack of coordination: Secondary | ICD-10-CM | POA: Diagnosis not present

## 2018-06-02 DIAGNOSIS — E785 Hyperlipidemia, unspecified: Secondary | ICD-10-CM | POA: Diagnosis not present

## 2018-06-02 DIAGNOSIS — R1312 Dysphagia, oropharyngeal phase: Secondary | ICD-10-CM | POA: Diagnosis not present

## 2018-06-02 DIAGNOSIS — R278 Other lack of coordination: Secondary | ICD-10-CM | POA: Diagnosis not present

## 2018-06-02 DIAGNOSIS — E119 Type 2 diabetes mellitus without complications: Secondary | ICD-10-CM | POA: Diagnosis not present

## 2018-06-02 DIAGNOSIS — J45909 Unspecified asthma, uncomplicated: Secondary | ICD-10-CM | POA: Diagnosis not present

## 2018-06-02 DIAGNOSIS — R41840 Attention and concentration deficit: Secondary | ICD-10-CM | POA: Diagnosis not present

## 2018-06-02 DIAGNOSIS — R748 Abnormal levels of other serum enzymes: Secondary | ICD-10-CM | POA: Diagnosis not present

## 2018-06-02 DIAGNOSIS — I10 Essential (primary) hypertension: Secondary | ICD-10-CM | POA: Diagnosis not present

## 2018-06-02 DIAGNOSIS — R4182 Altered mental status, unspecified: Secondary | ICD-10-CM | POA: Diagnosis not present

## 2018-06-05 DIAGNOSIS — N390 Urinary tract infection, site not specified: Secondary | ICD-10-CM | POA: Diagnosis not present

## 2018-06-05 DIAGNOSIS — E119 Type 2 diabetes mellitus without complications: Secondary | ICD-10-CM | POA: Diagnosis not present

## 2018-06-05 DIAGNOSIS — J45909 Unspecified asthma, uncomplicated: Secondary | ICD-10-CM | POA: Diagnosis not present

## 2018-06-05 DIAGNOSIS — I214 Non-ST elevation (NSTEMI) myocardial infarction: Secondary | ICD-10-CM | POA: Diagnosis not present

## 2018-06-05 DIAGNOSIS — R262 Difficulty in walking, not elsewhere classified: Secondary | ICD-10-CM | POA: Diagnosis not present

## 2018-07-03 DIAGNOSIS — E1151 Type 2 diabetes mellitus with diabetic peripheral angiopathy without gangrene: Secondary | ICD-10-CM | POA: Diagnosis not present

## 2018-07-03 DIAGNOSIS — F0150 Vascular dementia without behavioral disturbance: Secondary | ICD-10-CM | POA: Diagnosis not present

## 2018-07-03 DIAGNOSIS — I10 Essential (primary) hypertension: Secondary | ICD-10-CM | POA: Diagnosis not present

## 2018-07-28 DIAGNOSIS — R05 Cough: Secondary | ICD-10-CM | POA: Diagnosis not present

## 2018-07-31 DIAGNOSIS — E1151 Type 2 diabetes mellitus with diabetic peripheral angiopathy without gangrene: Secondary | ICD-10-CM | POA: Diagnosis not present

## 2018-07-31 DIAGNOSIS — F0150 Vascular dementia without behavioral disturbance: Secondary | ICD-10-CM | POA: Diagnosis not present

## 2018-07-31 DIAGNOSIS — I10 Essential (primary) hypertension: Secondary | ICD-10-CM | POA: Diagnosis not present

## 2018-08-20 DIAGNOSIS — Z23 Encounter for immunization: Secondary | ICD-10-CM | POA: Diagnosis not present

## 2018-09-03 DIAGNOSIS — E119 Type 2 diabetes mellitus without complications: Secondary | ICD-10-CM | POA: Diagnosis not present

## 2018-09-03 DIAGNOSIS — R1312 Dysphagia, oropharyngeal phase: Secondary | ICD-10-CM | POA: Diagnosis not present

## 2018-09-04 DIAGNOSIS — F0150 Vascular dementia without behavioral disturbance: Secondary | ICD-10-CM | POA: Diagnosis not present

## 2018-09-04 DIAGNOSIS — E1151 Type 2 diabetes mellitus with diabetic peripheral angiopathy without gangrene: Secondary | ICD-10-CM | POA: Diagnosis not present

## 2018-09-04 DIAGNOSIS — I10 Essential (primary) hypertension: Secondary | ICD-10-CM | POA: Diagnosis not present

## 2018-09-16 DIAGNOSIS — R7981 Abnormal blood-gas level: Secondary | ICD-10-CM | POA: Diagnosis not present

## 2018-09-16 DIAGNOSIS — R0602 Shortness of breath: Secondary | ICD-10-CM | POA: Diagnosis not present

## 2018-09-16 DIAGNOSIS — L602 Onychogryphosis: Secondary | ICD-10-CM | POA: Diagnosis not present

## 2018-10-03 DIAGNOSIS — E1151 Type 2 diabetes mellitus with diabetic peripheral angiopathy without gangrene: Secondary | ICD-10-CM | POA: Diagnosis not present

## 2018-10-03 DIAGNOSIS — I10 Essential (primary) hypertension: Secondary | ICD-10-CM | POA: Diagnosis not present

## 2018-10-03 DIAGNOSIS — J4520 Mild intermittent asthma, uncomplicated: Secondary | ICD-10-CM | POA: Diagnosis not present

## 2018-10-03 DIAGNOSIS — F0150 Vascular dementia without behavioral disturbance: Secondary | ICD-10-CM | POA: Diagnosis not present

## 2018-10-08 DIAGNOSIS — D649 Anemia, unspecified: Secondary | ICD-10-CM | POA: Diagnosis not present

## 2018-10-08 DIAGNOSIS — I10 Essential (primary) hypertension: Secondary | ICD-10-CM | POA: Diagnosis not present

## 2018-10-08 DIAGNOSIS — E119 Type 2 diabetes mellitus without complications: Secondary | ICD-10-CM | POA: Diagnosis not present

## 2018-10-08 DIAGNOSIS — E785 Hyperlipidemia, unspecified: Secondary | ICD-10-CM | POA: Diagnosis not present

## 2018-10-30 DIAGNOSIS — I10 Essential (primary) hypertension: Secondary | ICD-10-CM | POA: Diagnosis not present

## 2018-10-30 DIAGNOSIS — E1151 Type 2 diabetes mellitus with diabetic peripheral angiopathy without gangrene: Secondary | ICD-10-CM | POA: Diagnosis not present

## 2018-10-30 DIAGNOSIS — F0150 Vascular dementia without behavioral disturbance: Secondary | ICD-10-CM | POA: Diagnosis not present

## 2018-10-30 DIAGNOSIS — J4520 Mild intermittent asthma, uncomplicated: Secondary | ICD-10-CM | POA: Diagnosis not present

## 2018-11-28 DIAGNOSIS — E119 Type 2 diabetes mellitus without complications: Secondary | ICD-10-CM | POA: Diagnosis not present

## 2018-11-28 DIAGNOSIS — E1151 Type 2 diabetes mellitus with diabetic peripheral angiopathy without gangrene: Secondary | ICD-10-CM | POA: Diagnosis not present

## 2018-11-28 DIAGNOSIS — I10 Essential (primary) hypertension: Secondary | ICD-10-CM | POA: Diagnosis not present

## 2018-11-28 DIAGNOSIS — F0150 Vascular dementia without behavioral disturbance: Secondary | ICD-10-CM | POA: Diagnosis not present

## 2018-12-01 DIAGNOSIS — E119 Type 2 diabetes mellitus without complications: Secondary | ICD-10-CM | POA: Diagnosis not present

## 2018-12-02 DIAGNOSIS — E1151 Type 2 diabetes mellitus with diabetic peripheral angiopathy without gangrene: Secondary | ICD-10-CM | POA: Diagnosis not present

## 2018-12-02 DIAGNOSIS — L603 Nail dystrophy: Secondary | ICD-10-CM | POA: Diagnosis not present

## 2018-12-02 DIAGNOSIS — E119 Type 2 diabetes mellitus without complications: Secondary | ICD-10-CM | POA: Diagnosis not present

## 2018-12-02 DIAGNOSIS — Z7984 Long term (current) use of oral hypoglycemic drugs: Secondary | ICD-10-CM | POA: Diagnosis not present

## 2018-12-03 DIAGNOSIS — E119 Type 2 diabetes mellitus without complications: Secondary | ICD-10-CM | POA: Diagnosis not present

## 2018-12-04 DIAGNOSIS — E119 Type 2 diabetes mellitus without complications: Secondary | ICD-10-CM | POA: Diagnosis not present

## 2018-12-05 DIAGNOSIS — E119 Type 2 diabetes mellitus without complications: Secondary | ICD-10-CM | POA: Diagnosis not present

## 2018-12-15 DIAGNOSIS — E119 Type 2 diabetes mellitus without complications: Secondary | ICD-10-CM | POA: Diagnosis not present

## 2018-12-15 DIAGNOSIS — Z961 Presence of intraocular lens: Secondary | ICD-10-CM | POA: Diagnosis not present

## 2018-12-17 DIAGNOSIS — E119 Type 2 diabetes mellitus without complications: Secondary | ICD-10-CM | POA: Diagnosis not present

## 2018-12-17 DIAGNOSIS — D649 Anemia, unspecified: Secondary | ICD-10-CM | POA: Diagnosis not present

## 2018-12-17 DIAGNOSIS — E785 Hyperlipidemia, unspecified: Secondary | ICD-10-CM | POA: Diagnosis not present

## 2018-12-17 DIAGNOSIS — I10 Essential (primary) hypertension: Secondary | ICD-10-CM | POA: Diagnosis not present

## 2018-12-26 DIAGNOSIS — J45909 Unspecified asthma, uncomplicated: Secondary | ICD-10-CM | POA: Diagnosis not present

## 2018-12-26 DIAGNOSIS — R05 Cough: Secondary | ICD-10-CM | POA: Diagnosis not present

## 2018-12-26 DIAGNOSIS — F0150 Vascular dementia without behavioral disturbance: Secondary | ICD-10-CM | POA: Diagnosis not present

## 2018-12-27 DIAGNOSIS — R918 Other nonspecific abnormal finding of lung field: Secondary | ICD-10-CM | POA: Diagnosis not present

## 2018-12-31 DIAGNOSIS — R05 Cough: Secondary | ICD-10-CM | POA: Diagnosis not present

## 2018-12-31 DIAGNOSIS — R509 Fever, unspecified: Secondary | ICD-10-CM | POA: Diagnosis not present

## 2019-01-30 DIAGNOSIS — R05 Cough: Secondary | ICD-10-CM | POA: Diagnosis not present

## 2019-01-30 DIAGNOSIS — J45909 Unspecified asthma, uncomplicated: Secondary | ICD-10-CM | POA: Diagnosis not present

## 2019-01-30 DIAGNOSIS — F0150 Vascular dementia without behavioral disturbance: Secondary | ICD-10-CM | POA: Diagnosis not present

## 2019-02-27 DIAGNOSIS — F0150 Vascular dementia without behavioral disturbance: Secondary | ICD-10-CM | POA: Diagnosis not present

## 2019-02-27 DIAGNOSIS — J449 Chronic obstructive pulmonary disease, unspecified: Secondary | ICD-10-CM | POA: Diagnosis not present

## 2019-02-27 DIAGNOSIS — R634 Abnormal weight loss: Secondary | ICD-10-CM | POA: Diagnosis not present

## 2019-03-13 DEATH — deceased
